# Patient Record
Sex: Female | Born: 1940 | Race: White | Hispanic: No | Smoking: Former smoker
Health system: Southern US, Community
[De-identification: ages and names within clinical notes are randomized; demographics above are authoritative.]

## PROBLEM LIST (undated history)

## (undated) DIAGNOSIS — R7303 Prediabetes: Secondary | ICD-10-CM

## (undated) DIAGNOSIS — R112 Nausea with vomiting, unspecified: Secondary | ICD-10-CM

## (undated) DIAGNOSIS — M069 Rheumatoid arthritis, unspecified: Secondary | ICD-10-CM

## (undated) DIAGNOSIS — T8859XA Other complications of anesthesia, initial encounter: Secondary | ICD-10-CM

## (undated) DIAGNOSIS — I776 Arteritis, unspecified: Secondary | ICD-10-CM

## (undated) DIAGNOSIS — M1990 Unspecified osteoarthritis, unspecified site: Secondary | ICD-10-CM

## (undated) DIAGNOSIS — R5382 Chronic fatigue, unspecified: Secondary | ICD-10-CM

## (undated) DIAGNOSIS — M797 Fibromyalgia: Secondary | ICD-10-CM

## (undated) DIAGNOSIS — M5430 Sciatica, unspecified side: Secondary | ICD-10-CM

## (undated) DIAGNOSIS — T4145XA Adverse effect of unspecified anesthetic, initial encounter: Secondary | ICD-10-CM

## (undated) DIAGNOSIS — Z9889 Other specified postprocedural states: Secondary | ICD-10-CM

## (undated) DIAGNOSIS — M7580 Other shoulder lesions, unspecified shoulder: Secondary | ICD-10-CM

## (undated) HISTORY — PX: ANUS SURGERY: SHX302

## (undated) HISTORY — DX: Chronic fatigue, unspecified: R53.82

## (undated) HISTORY — PX: TONSILLECTOMY: SUR1361

## (undated) HISTORY — DX: Unspecified osteoarthritis, unspecified site: M19.90

## (undated) HISTORY — DX: Other shoulder lesions, unspecified shoulder: M75.80

## (undated) HISTORY — PX: BACK SURGERY: SHX140

## (undated) HISTORY — PX: ABDOMINAL HYSTERECTOMY: SHX81

## (undated) HISTORY — PX: CHOLECYSTECTOMY: SHX55

## (undated) HISTORY — PX: TUBAL LIGATION: SHX77

## (undated) HISTORY — DX: Fibromyalgia: M79.7

## (undated) HISTORY — DX: Rheumatoid arthritis, unspecified: M06.9

## (undated) HISTORY — PX: OTHER SURGICAL HISTORY: SHX169

## (undated) HISTORY — DX: Sciatica, unspecified side: M54.30

## (undated) HISTORY — DX: Arteritis, unspecified: I77.6

---

## 2000-09-09 ENCOUNTER — Encounter: Payer: Self-pay | Admitting: Internal Medicine

## 2000-09-09 ENCOUNTER — Ambulatory Visit (HOSPITAL_COMMUNITY): Admission: RE | Admit: 2000-09-09 | Discharge: 2000-09-09 | Payer: Self-pay | Admitting: Internal Medicine

## 2001-07-13 ENCOUNTER — Encounter (HOSPITAL_COMMUNITY): Admission: RE | Admit: 2001-07-13 | Discharge: 2001-08-12 | Payer: Self-pay | Admitting: General Surgery

## 2001-09-05 ENCOUNTER — Encounter (HOSPITAL_COMMUNITY): Admission: RE | Admit: 2001-09-05 | Discharge: 2001-10-05 | Payer: Self-pay | Admitting: Rheumatology

## 2001-10-31 ENCOUNTER — Ambulatory Visit: Admission: RE | Admit: 2001-10-31 | Discharge: 2001-10-31 | Payer: Self-pay | Admitting: General Surgery

## 2002-04-23 ENCOUNTER — Encounter: Payer: Self-pay | Admitting: Emergency Medicine

## 2002-04-23 ENCOUNTER — Emergency Department (HOSPITAL_COMMUNITY): Admission: EM | Admit: 2002-04-23 | Discharge: 2002-04-23 | Payer: Self-pay | Admitting: Emergency Medicine

## 2002-04-30 ENCOUNTER — Encounter: Payer: Self-pay | Admitting: General Surgery

## 2002-04-30 ENCOUNTER — Ambulatory Visit (HOSPITAL_COMMUNITY): Admission: RE | Admit: 2002-04-30 | Discharge: 2002-04-30 | Payer: Self-pay | Admitting: General Surgery

## 2002-05-29 ENCOUNTER — Encounter (HOSPITAL_COMMUNITY): Admission: RE | Admit: 2002-05-29 | Discharge: 2002-06-28 | Payer: Self-pay | Admitting: Oncology

## 2002-05-29 ENCOUNTER — Encounter: Admission: RE | Admit: 2002-05-29 | Discharge: 2002-05-29 | Payer: Self-pay | Admitting: Oncology

## 2002-06-26 ENCOUNTER — Ambulatory Visit (HOSPITAL_COMMUNITY): Admission: RE | Admit: 2002-06-26 | Discharge: 2002-06-26 | Payer: Self-pay | Admitting: Orthopaedic Surgery

## 2003-02-05 ENCOUNTER — Ambulatory Visit (HOSPITAL_COMMUNITY): Admission: RE | Admit: 2003-02-05 | Discharge: 2003-02-05 | Payer: Self-pay | Admitting: General Surgery

## 2003-02-13 ENCOUNTER — Inpatient Hospital Stay (HOSPITAL_COMMUNITY): Admission: AD | Admit: 2003-02-13 | Discharge: 2003-02-20 | Payer: Self-pay | Admitting: General Surgery

## 2003-02-26 ENCOUNTER — Encounter: Admission: RE | Admit: 2003-02-26 | Discharge: 2003-02-26 | Payer: Self-pay | Admitting: Oncology

## 2003-02-26 ENCOUNTER — Encounter (HOSPITAL_COMMUNITY): Admission: RE | Admit: 2003-02-26 | Discharge: 2003-03-28 | Payer: Self-pay | Admitting: Oncology

## 2003-03-02 ENCOUNTER — Ambulatory Visit (HOSPITAL_COMMUNITY): Admission: RE | Admit: 2003-03-02 | Discharge: 2003-03-02 | Payer: Self-pay | Admitting: General Surgery

## 2006-04-15 ENCOUNTER — Encounter (HOSPITAL_COMMUNITY): Admission: RE | Admit: 2006-04-15 | Discharge: 2006-05-15 | Payer: Self-pay | Admitting: Oncology

## 2006-04-26 ENCOUNTER — Ambulatory Visit (HOSPITAL_COMMUNITY): Payer: Self-pay | Admitting: Oncology

## 2006-06-28 ENCOUNTER — Ambulatory Visit (HOSPITAL_COMMUNITY): Admission: RE | Admit: 2006-06-28 | Discharge: 2006-06-28 | Payer: Self-pay | Admitting: Family Medicine

## 2008-01-23 ENCOUNTER — Ambulatory Visit (HOSPITAL_COMMUNITY): Admission: RE | Admit: 2008-01-23 | Discharge: 2008-01-23 | Payer: Self-pay | Admitting: Family Medicine

## 2008-08-12 ENCOUNTER — Ambulatory Visit (HOSPITAL_COMMUNITY): Admission: RE | Admit: 2008-08-12 | Discharge: 2008-08-12 | Payer: Self-pay | Admitting: Family Medicine

## 2009-03-31 ENCOUNTER — Encounter (INDEPENDENT_AMBULATORY_CARE_PROVIDER_SITE_OTHER): Payer: Self-pay | Admitting: *Deleted

## 2009-03-31 LAB — CONVERTED CEMR LAB
ALT: 19 units/L
AST: 21 units/L
Albumin: 4.2 g/dL
Alkaline Phosphatase: 56 units/L
Bilirubin, Direct: 0.1 mg/dL
Cholesterol: 205 mg/dL
HDL: 48 mg/dL
LDL Cholesterol: 129 mg/dL
Total Protein: 6.7 g/dL
Triglycerides: 138 mg/dL

## 2009-08-12 ENCOUNTER — Encounter: Payer: Self-pay | Admitting: Orthopedic Surgery

## 2009-08-12 ENCOUNTER — Encounter (INDEPENDENT_AMBULATORY_CARE_PROVIDER_SITE_OTHER): Payer: Self-pay | Admitting: *Deleted

## 2009-08-12 DIAGNOSIS — Z8719 Personal history of other diseases of the digestive system: Secondary | ICD-10-CM | POA: Insufficient documentation

## 2009-08-12 DIAGNOSIS — IMO0001 Reserved for inherently not codable concepts without codable children: Secondary | ICD-10-CM | POA: Insufficient documentation

## 2009-08-12 DIAGNOSIS — F329 Major depressive disorder, single episode, unspecified: Secondary | ICD-10-CM | POA: Insufficient documentation

## 2009-08-12 DIAGNOSIS — I10 Essential (primary) hypertension: Secondary | ICD-10-CM | POA: Insufficient documentation

## 2009-08-12 DIAGNOSIS — D649 Anemia, unspecified: Secondary | ICD-10-CM | POA: Insufficient documentation

## 2009-08-12 DIAGNOSIS — F411 Generalized anxiety disorder: Secondary | ICD-10-CM | POA: Insufficient documentation

## 2009-08-14 ENCOUNTER — Ambulatory Visit: Payer: Self-pay | Admitting: Cardiology

## 2009-08-14 DIAGNOSIS — R5381 Other malaise: Secondary | ICD-10-CM | POA: Insufficient documentation

## 2009-08-14 DIAGNOSIS — R5383 Other fatigue: Secondary | ICD-10-CM

## 2009-08-14 DIAGNOSIS — R079 Chest pain, unspecified: Secondary | ICD-10-CM | POA: Insufficient documentation

## 2009-08-15 ENCOUNTER — Encounter: Payer: Self-pay | Admitting: Cardiology

## 2009-08-15 ENCOUNTER — Encounter (INDEPENDENT_AMBULATORY_CARE_PROVIDER_SITE_OTHER): Payer: Self-pay | Admitting: *Deleted

## 2009-08-15 DIAGNOSIS — F17200 Nicotine dependence, unspecified, uncomplicated: Secondary | ICD-10-CM | POA: Insufficient documentation

## 2009-08-15 LAB — CONVERTED CEMR LAB
ALT: 11 units/L
AST: 19 units/L
Albumin: 4.4 g/dL
Alkaline Phosphatase: 50 units/L
BUN: 10 mg/dL
Basophils Absolute: 0 10*3/uL
Basophils Relative: 1 %
CO2: 27 meq/L
Calcium: 9.7 mg/dL
Chloride: 101 meq/L
Creatinine, Ser: 0.82 mg/dL
Eosinophils Absolute: 0.1 10*3/uL
Eosinophils Relative: 2 %
Glucose, Bld: 96 mg/dL
HCT: 30.8 %
Hemoglobin: 9.2 g/dL
Lymphocytes Relative: 36 %
Lymphs Abs: 0.1 10*3/uL
MCHC: 4.83 g/dL
MCV: 63.8 fL
Monocytes Absolute: 0.1 10*3/uL
Neutro Abs: 2 10*3/uL
Platelets: 321 10*3/uL
Potassium: 5.1 meq/L
RDW: 15.9 %
Sodium: 138 meq/L
TSH: 0.703 microintl units/mL
Total Protein: 7 g/dL
WBC: 5.6 10*3/uL

## 2009-08-18 ENCOUNTER — Encounter (INDEPENDENT_AMBULATORY_CARE_PROVIDER_SITE_OTHER): Payer: Self-pay | Admitting: *Deleted

## 2009-08-18 LAB — CONVERTED CEMR LAB
ALT: 11 units/L (ref 0–35)
AST: 19 units/L (ref 0–37)
Albumin: 4.4 g/dL (ref 3.5–5.2)
Alkaline Phosphatase: 50 units/L (ref 39–117)
BUN: 10 mg/dL (ref 6–23)
Basophils Absolute: 0 10*3/uL (ref 0.0–0.1)
Basophils Relative: 1 % (ref 0–1)
CO2: 27 meq/L (ref 19–32)
Calcium: 9.7 mg/dL (ref 8.4–10.5)
Chloride: 101 meq/L (ref 96–112)
Cortisol, Plasma: 7.4 ug/dL
Creatinine, Ser: 0.82 mg/dL (ref 0.40–1.20)
Eosinophils Absolute: 0.1 10*3/uL (ref 0.0–0.7)
Eosinophils Relative: 2 % (ref 0–5)
Glucose, Bld: 96 mg/dL (ref 70–99)
HCT: 30.8 % — ABNORMAL LOW (ref 36.0–46.0)
Hemoglobin: 9.2 g/dL — ABNORMAL LOW (ref 12.0–15.0)
Lymphocytes Relative: 36 % (ref 12–46)
Lymphs Abs: 2 10*3/uL (ref 0.7–4.0)
MCHC: 29.9 g/dL — ABNORMAL LOW (ref 30.0–36.0)
MCV: 63.8 fL — ABNORMAL LOW (ref 78.0–100.0)
Monocytes Absolute: 0.1 10*3/uL (ref 0.1–1.0)
Monocytes Relative: 1 % — ABNORMAL LOW (ref 3–12)
Neutro Abs: 3.5 10*3/uL (ref 1.7–7.7)
Neutrophils Relative %: 61 % (ref 43–77)
Platelets: 321 10*3/uL (ref 150–400)
Potassium: 5.1 meq/L (ref 3.5–5.3)
RBC: 4.83 M/uL (ref 3.87–5.11)
RDW: 15.9 % — ABNORMAL HIGH (ref 11.5–15.5)
Sodium: 138 meq/L (ref 135–145)
TSH: 0.703 microintl units/mL (ref 0.350–4.500)
Total Bilirubin: 0.5 mg/dL (ref 0.3–1.2)
Total Protein: 7 g/dL (ref 6.0–8.3)
WBC: 5.6 10*3/uL (ref 4.0–10.5)

## 2009-08-26 ENCOUNTER — Telehealth (INDEPENDENT_AMBULATORY_CARE_PROVIDER_SITE_OTHER): Payer: Self-pay | Admitting: *Deleted

## 2009-08-29 ENCOUNTER — Encounter (INDEPENDENT_AMBULATORY_CARE_PROVIDER_SITE_OTHER): Payer: Self-pay | Admitting: *Deleted

## 2009-08-29 ENCOUNTER — Ambulatory Visit: Payer: Self-pay | Admitting: Cardiology

## 2009-08-29 LAB — CONVERTED CEMR LAB
Ferritin: 150 ng/mL
Ferritin: 150 ng/mL (ref 10–291)
Iron: 36 ug/dL
Iron: 36 ug/dL — ABNORMAL LOW (ref 42–145)
OCCULT 1: NEGATIVE
OCCULT 2: NEGATIVE
OCCULT 3: NEGATIVE
Saturation Ratios: 11 %
Saturation Ratios: 11 % — ABNORMAL LOW (ref 20–55)
TIBC: 314 ug/dL
TIBC: 314 ug/dL (ref 250–470)
UIBC: 278 ug/dL
UIBC: 278 ug/dL

## 2009-09-01 ENCOUNTER — Encounter: Payer: Self-pay | Admitting: Cardiology

## 2009-09-04 ENCOUNTER — Encounter: Payer: Self-pay | Admitting: Cardiology

## 2009-09-04 ENCOUNTER — Ambulatory Visit (HOSPITAL_COMMUNITY): Admission: RE | Admit: 2009-09-04 | Discharge: 2009-09-04 | Payer: Self-pay | Admitting: Cardiology

## 2009-09-04 ENCOUNTER — Ambulatory Visit: Payer: Self-pay | Admitting: Cardiology

## 2009-09-24 ENCOUNTER — Encounter (INDEPENDENT_AMBULATORY_CARE_PROVIDER_SITE_OTHER): Payer: Self-pay | Admitting: *Deleted

## 2009-10-27 ENCOUNTER — Encounter (INDEPENDENT_AMBULATORY_CARE_PROVIDER_SITE_OTHER): Payer: Self-pay | Admitting: *Deleted

## 2009-10-28 ENCOUNTER — Ambulatory Visit: Payer: Self-pay | Admitting: Cardiology

## 2009-12-26 ENCOUNTER — Encounter: Payer: Self-pay | Admitting: Orthopedic Surgery

## 2010-01-14 ENCOUNTER — Ambulatory Visit: Payer: Self-pay | Admitting: Orthopedic Surgery

## 2010-01-14 DIAGNOSIS — M5030 Other cervical disc degeneration, unspecified cervical region: Secondary | ICD-10-CM | POA: Insufficient documentation

## 2010-01-14 DIAGNOSIS — M25819 Other specified joint disorders, unspecified shoulder: Secondary | ICD-10-CM | POA: Insufficient documentation

## 2010-01-14 DIAGNOSIS — M75100 Unspecified rotator cuff tear or rupture of unspecified shoulder, not specified as traumatic: Secondary | ICD-10-CM | POA: Insufficient documentation

## 2010-01-14 DIAGNOSIS — M7580 Other shoulder lesions, unspecified shoulder: Secondary | ICD-10-CM

## 2010-01-16 ENCOUNTER — Encounter: Payer: Self-pay | Admitting: Orthopedic Surgery

## 2010-01-26 ENCOUNTER — Encounter (HOSPITAL_COMMUNITY)
Admission: RE | Admit: 2010-01-26 | Discharge: 2010-02-25 | Payer: Self-pay | Source: Home / Self Care | Admitting: Orthopedic Surgery

## 2010-02-03 ENCOUNTER — Encounter: Payer: Self-pay | Admitting: Orthopedic Surgery

## 2010-02-25 ENCOUNTER — Encounter: Payer: Self-pay | Admitting: Orthopedic Surgery

## 2010-02-27 ENCOUNTER — Encounter (HOSPITAL_COMMUNITY)
Admission: RE | Admit: 2010-02-27 | Discharge: 2010-03-29 | Payer: Self-pay | Source: Home / Self Care | Attending: Orthopedic Surgery | Admitting: Orthopedic Surgery

## 2010-04-01 ENCOUNTER — Encounter (HOSPITAL_COMMUNITY)
Admission: RE | Admit: 2010-04-01 | Discharge: 2010-04-28 | Payer: Self-pay | Source: Home / Self Care | Attending: Orthopedic Surgery | Admitting: Orthopedic Surgery

## 2010-04-02 ENCOUNTER — Encounter: Payer: Self-pay | Admitting: Orthopedic Surgery

## 2010-04-28 NOTE — Letter (Signed)
Summary: Previous office note from Dr. Dion Saucier  Previous office note from Dr. Dion Saucier   Imported By: Jacklynn Ganong 01/16/2010 08:16:25  _____________________________________________________________________  External Attachment:    Type:   Image     Comment:   External Document

## 2010-04-28 NOTE — Miscellaneous (Signed)
Summary: labs cbcd,cmp,08/15/2009  Clinical Lists Changes  Observations: Added new observation of ABSOLUTE BAS: 0.0 K/uL (08/15/2009 15:47) Added new observation of BASOPHIL %: 1 % (08/15/2009 15:47) Added new observation of EOS ABSLT: 0.1 K/uL (08/15/2009 15:47) Added new observation of % EOS AUTO: 2 % (08/15/2009 15:47) Added new observation of ABSOLUTE MON: 0.1 K/uL (08/15/2009 15:47) Added new observation of ABS LYMPHOCY: 0.1 K/uL (08/15/2009 15:47) Added new observation of LYMPHS %: 36 % (08/15/2009 15:47) Added new observation of ABS NEUTROPH: 2.0 K/uL (08/15/2009 15:47) Added new observation of RDW: 15.9 % (08/15/2009 15:47) Added new observation of MCHC RBC: 4.83 g/dL (16/12/9602 54:09) Added new observation of CALCIUM: 9.7 mg/dL (81/19/1478 29:56) Added new observation of ALBUMIN: 4.4 g/dL (21/30/8657 84:69) Added new observation of PROTEIN, TOT: 7.0 g/dL (62/95/2841 32:44) Added new observation of SGPT (ALT): 11 units/L (08/15/2009 15:47) Added new observation of SGOT (AST): 19 units/L (08/15/2009 15:47) Added new observation of ALK PHOS: 50 units/L (08/15/2009 15:47) Added new observation of CREATININE: 0.82 mg/dL (03/31/7251 66:44) Added new observation of BUN: 10 mg/dL (03/47/4259 56:38) Added new observation of BG RANDOM: 96 mg/dL (75/64/3329 51:88) Added new observation of CO2 PLSM/SER: 27 meq/L (08/15/2009 15:47) Added new observation of CL SERUM: 101 meq/L (08/15/2009 15:47) Added new observation of K SERUM: 5.1 meq/L (08/15/2009 15:47) Added new observation of NA: 138 meq/L (08/15/2009 15:47) Added new observation of PLATELETK/UL: 321 K/uL (08/15/2009 15:47) Added new observation of MCV: 63.8 fL (08/15/2009 15:47) Added new observation of HCT: 30.8 % (08/15/2009 15:47) Added new observation of HGB: 9.2 g/dL (41/66/0630 16:01) Added new observation of WBC COUNT: 5.6 10*3/microliter (08/15/2009 15:47) Added new observation of TSH: 0.703 microintl units/mL (08/15/2009  15:47)

## 2010-04-28 NOTE — Letter (Signed)
Summary: Notes from Dr Hilda Lias, MD  Notes from Dr Hilda Lias, MD   Imported By: Jacklynn Ganong 01/21/2010 15:39:22  _____________________________________________________________________  External Attachment:    Type:   Image     Comment:   External Document

## 2010-04-28 NOTE — Letter (Signed)
Summary: Stress Echocardiogram Information Sheet  [facility]  [address] [place] [address]   Phone: [phone]  Fax: [phone]      Aug 14, 2009 MRN: [id number] light prior to the test.   Alfa Florendo  Doctor: Appointment Date: Appointment Time: Appointment Location: [facility]  Stress Echocardiogram Information Sheet    Instructions:   1. DO NOT  take your _________ medicine _______ days before the test.  2. Eat light prior to the test.  3. Dress prepared to exercise.  4. DO NOT use ANY caffine or tobacco products 3 hours before appointment.  5. Report to the Short Stay Center on the1st floor.  6. Please bring all current prescription medications.  7. If you have any questions, please call [phone]  8. Do not take Metoprolol the morning of test.

## 2010-04-28 NOTE — Miscellaneous (Signed)
Summary: hemoccult cards 08/29/2009  Clinical Lists Changes  Observations: Added new observation of HEMOCCULT 3: neg (08/29/2009 17:12) Added new observation of HEMOCCULT 2: neg (08/29/2009 17:12) Added new observation of HEMOCCULT 1: neg (08/29/2009 17:12)

## 2010-04-28 NOTE — Letter (Signed)
Summary: [facility] at [facility]  [address], [place] [address]   Phone: [phone]  Fax: [phone]      September 01, 2009 MRN: [id number]   [facility] [address] [place], [place]  [address]   Dear Ms. Blauvelt,  Your test ordered by Selena Batten has been reviewed by your physician (or physician assistant) and was found to be normal or stable. Your physician (or physician assistant) felt no changes were needed at this time.  ____ Echocardiogram  ____ Cardiac Stress Test  __X__ Lab Work  ____ Peripheral vascular study of arms, legs or neck  ____ CT scan or X-ray  ____ Lung or Breathing test  ____ Other: Please continue on current medical treatment.   Thank you.   [place] Bing, MD, F.A.C.C

## 2010-04-28 NOTE — Assessment & Plan Note (Signed)
Summary: ROV   Visit Type:  Follow-up Primary Provider:  Dr. Katharine Look   History of Present Illness: Kiara [name] returns to the office as previously scheduled for continuing assessment and treatment of chest discomfort, generalized weakness and exercise intolerance.  Since her last visit, symptoms have improved spontaneously.  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  For the most part, she has been able to do all of her usual activities without difficulty including maintaining her garden and performing housework.  She has had no significant chest discomfort and no palpitations.  There has been no lightheadedness nor syncope.  Current Medications (verified): 1)  Xanax 1 Mg Tabs (Alprazolam) .... Take 1 Tab At Bedtime 2)  Coq10 100 Mg Caps (Coenzyme Q10) .... T5ake 1 Tab Daily 3)  Caltrate 600 1500 Mg Tabs (Calcium Carbonate) .... Take 1 Tab Daily 4)  B-100 Complex  Tabs (Vitamins-Lipotropics) .... Take 1 Tab Daily 5)  Hydrocodone-Acetaminophen 7.5-650 Mg Tabs (Hydrocodone-Acetaminophen) .... Take As Needed For Pain 6)  Metoprolol Succinate 50 Mg Xr24h-Tab (Metoprolol Succinate) .... Take 1 Tab Daily 7)  Pepcid 40 Mg Tabs (Famotidine) .... Take 1 Tab Daily 8)  Amitriptyline Hcl 50 Mg Tabs (Amitriptyline Hcl) .... Take 1 or 2 Tabs At Bedtime  Allergies (verified): 1)  ! * Nonsteroidals 2)  ! Steroids  Past History:  PMH, FH, and Social History reviewed and updated.  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  Vital Signs:  Patient profile:   70 year old female Weight:      172 pounds Pulse rate:   83 / minute BP sitting:   122 / 69  (right arm)  Vitals Entered By: Dreama Saa, CNA (October 28, 2009 11:28 AM)  Physical Exam  General:  No acute distress; normal affect HEENT-[place]/AT; PERRL; EOM intact; conjunctiva and lids nl:  Neck-No JVD; no carotid bruits: Endocrine-No thyromegaly: Lungs-No tachypnea, clear without rales, rhonchi or wheezes: CV-normal PMI; split S1; normal S2 Abdomen-BS normal; soft and non-tender without masses or organomegaly: Neurologic-Nl cranial nerves; symmetric strength and tone: Skin- Warm, patches of erythema over the lower extremities Extremities-Nl distal pulses; no edema    Impression & Recommendations:  Problem # 1:  CHEST PAIN (ICD-786.50) Symptoms have resolved spontaneously.  A negative stress echocardiogram provides additional reassurance of the absence of significant cardiovascular disease.  No further evaluation is necessary.  Problem # 2:  OTHER MALAISE AND FATIGUE (ICD-780.79) Patient has a chronic microcytic anemia with low iron but normal ferritin.  She has previously been evaluated by Dr. 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  Her moderate anemia could certainly be contributing to her symptoms.  Problem # 3:  HYPERTENSION (ICD-401.9) Blood pressure remains well controlled on a single agent.  Problem # 4:  DEPRESSION (ICD-311) Patient is much more animated and positive at this visit.  Possibly, symptoms were related to depression, which is now improved.  I see no ongoing cardiovascular issues of significance in this nice woman and will be happy to  see her at anytime in the future that Dr. Sudie Bailey deems appropriate.  Patient Instructions: 1)  Your physician recommends that you schedule a follow-up appointment in: as needed  2)  Your physician  recommends that you continue on your current medications as directed. Please refer to the Current Medication list given to you today.  Prevention & Chronic Care Immunizations   Influenza vaccine: Not documented    Tetanus booster: Not documented    Pneumococcal vaccine: Not documented    H. zoster vaccine: Not documented  Colorectal Screening   Hemoccult: Not documented    Colonoscopy: Not documented  Other Screening   Pap smear: Not documented    Mammogram: Not documented    DXA bone density scan: Not documented   Smoking status: quit  (08/12/2009)  Lipids   Total Cholesterol: 205  (03/31/2009)   LDL: 129  (03/31/2009)   LDL Direct: Not documented   HDL: 48  (03/31/2009)   Triglycerides: 138  (03/31/2009)  Hypertension   Last Blood Pressure: 122 / 69  (10/28/2009)   Serum creatinine: 0.82  (08/15/2009)   Serum potassium 5.1  (08/15/2009)  Self-Management Support :    Hypertension self-management support: Not documented

## 2010-04-28 NOTE — Assessment & Plan Note (Signed)
Summary: CONSULT/TREAT LT SHOULDER PAIN/NEEDS XRAY/MEDICARE,MEDICAID/R...   Vital Signs:  Patient profile:   70 year old female Height:      65 inches Weight:      175 pounds  Visit Type:  new patient Referring Provider:  self Primary Provider:  Dr. Katharine Look  CC:  neck pain.  History of Present Illness: I saw Kiara [name] in the office today for an initial visit.  She is a 70 years old woman with the complaint of:  neck pain that radiates to the shoulder.  No injury.  MRI and xrays of C spine from 2010 for review.  Meds: Metoprolol 50mg , Venlafaxine hcl 5mg , Amitriptyline 100mg , Xanax 1mg , Hydrocodone 7.5/650, Vitamins.  The patient presents with pain in her neck which radiates to her LEFT shoulder and also over the anterolateral acromion posterior scapula which is described as dull throbbing stabbing and burning in it is constant.  She has a history of a cervical spine vertebral fracture in 1979 which she recovered from.  She was seen last year in [place] for neck and shoulder pain and was told that she would need some rotator cuff surgery and that her pain was not primarily coming from her neck despite an MRI done in May of 2010 which shows C5 and 6, C6 and 7 broad-based disc protrusions but no foraminal stenosis.  Also on an MRI there was a C3-C4 disc shallow protrusion but no stenosis.  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  Several nodes are also included however they are from Dr. Hilda Lias and Dr. Michelle Nasuti office notes.  Dr. Hilda Lias has seen her for her hip for bursitis on the RIGHT medial meniscal tear.  She's had a history of ruptured disc in her lower back.  Dr. Sanjuan Dame notes go back to 2004.          Allergies: 1)  ! * Nonsteroidals 2)  ! 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  Past Surgical History: Tonsillectomy Partial thyroidectomy Tubal ligation Hysterectomy-1985 Cholecystectomy-1995 Lumbosacral spine surgery x3 in 1978, 1980 and 1984 Laparoscopic knee surgery x2 in 1989 and 2005 left anal  Family History: Father died at age 24 due to pneumonia Mother died at age 70 with congestive heart failure Siblings: 4 brothers are alive and well; one of 3 sisters suffered a fatal CVA at age 61 Family History of Diabetes Family History Coronary Heart Disease female < 20 Family History of Arthritis  Social History: Disabled  Marital-divorced Tobacco Use -no, quit Alcohol Use - no Regular Exercise - no Drug Use - no 3 cups of caffeine per day GED  Review of Systems Constitutional:  Complains of fever, chills, and fatigue; denies weight loss and weight gain. Cardiovascular:  Complains of fainting; denies chest pain, palpitations, and murmurs. Respiratory:  Complains of short of breath and snoring; denies wheezing, couch, tightness, pain on inspiration, and snoring . Gastrointestinal:  Complains of blood in your stools; denies heartburn, nausea, vomiting, diarrhea, and constipation. Genitourinary:  Complains of difficulty urinating, painful urination, flank pain, and bleeding in urine; denies frequency and  urgency. Neurologic:  Denies numbness, tingling, unsteady gait, dizziness, tremors, and seizure. Musculoskeletal:  Complains of joint pain, swelling, instability, stiffness, redness, heat, and muscle pain. Endocrine:  Complains of heat or cold intolerance; denies excessive thirst and exessive  urination. Psychiatric:  Complains of nervousness and depression; denies anxiety and hallucinations. Skin:  Complains of changes in the skin, rash, itching, and redness; denies poor healing. HEENT:  Complains of eye pain and redness; denies blurred or double vision and watering. Immunology:  Complains of seasonal allergies; denies sinus problems and allergic to bee stings. Hemoatologic:  Denies easy bleeding and brusing.  Physical Exam  Additional Exam:  GEN: well developed, well nourished, normal grooming and hygiene, no deformity and normal body habitus.   CDV: pulses are normal, no edema, no erythema. no tenderness  Lymph: normal lymph nodes   Skin: no rashes, skin lesions or open sores   NEURO: normal coordination, reflexes, sensation.   Psyche: awake, alert and oriented. Mood normal   Gait: Normal  She does lasted cervical spine stiffness loss of motion and tenderness at the base of cervical spine.  Spurling sign is negative.  She has some LEFT shoulder pain along the a.c. joint, acromion.  Posterior subacromial space.  She has painful range of motion in the arc of 90-180.  His active range of motion of her 120.  She has mild weakness in the rotator cuff supraspinatus.  She has a negative Hornblower sign.  Negative drop test.  She has a positive impingement sign with Neer maneuver and also with the Hawkins maneuver.  An abduction external rotation she is stable and she has a negative sulcus sign.       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  Problem # 2:  DEGENERATIVE DISC DISEASE, CERVICAL SPINE  (ICD-722.4) Assessment: New  seek treatment with neurosurgeon if needed for the cervical spine  Other Orders: Physical Therapy Referral (PT) Consultation Level III (91478) Joint Aspirate / Injection, Large (20610)  Patient Instructions: 1)  Start physical therapy for the left shoulder Rotator Cuff Disease  2)  If things are not better after 8 weeks then call us back.    Orders Added: 1)  Physical Therapy Referral [PT] 2)  Consultation Level III [29562] 3)  Joint Aspirate / Injection, Large [20610]  Appended Document: CONSULT/TREAT LT SHOULDER PAIN/NEEDS XRAY/MEDICARE,MEDICAID/R... Kiara [name] saw Dr. 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 He offered her injection and physical therapy, but she indicated she was allergic to cortisone and she did here. She had decided to go ahead with a cuff repair, but eventually declined.

## 2010-04-28 NOTE — Progress Notes (Signed)
Summary: questions about stool card and labs  Phone Note Call from Patient Call back at Home Phone [phone]   Caller: pt Reason for Call: Talk to Nurse Summary of Call: S: pt left message on Monday that she was having a hard time figuring out when she was supose to be doing her stool cards and her lab work. Initial call taken by: Faythe Ghee,  Aug 26, 2009 8:45 AM  Follow-up for Phone Call        B: stool cards ordered from [facility] on 08/15/09 A: pt didn't do cards correctly by instructions R: will come to office and pick up another set of 3 Follow-up by: Teressa Lower RN,  Aug 26, 2009 9:45 AM

## 2010-04-28 NOTE — Miscellaneous (Signed)
Summary: OT clinical evaluation  OT clinical evaluation   Imported By: Jacklynn Ganong 02/03/2010 16:59:10  _____________________________________________________________________  External Attachment:    Type:   Image     Comment:   External Document

## 2010-04-28 NOTE — Letter (Signed)
Summary: *Orthopedic Consult Note  [facility]  [address] [place] [address]  [place], [place] [address]   Phone: [phone]  Fax: [phone]    Re:    Kiara [name] DOB:    01/27/1941   Dear: Dr Sudie Bailey   Thank you for requesting that we see the above patient for consultation.  A copy of the detailed office note will be sent under separate cover, for your review.  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  Sincerely,   Terrance Mass. MD.

## 2010-04-28 NOTE — Miscellaneous (Signed)
Summary: OT progress note  OT progress note   Imported By: Jacklynn Ganong 02/26/2010 09:16:41  _____________________________________________________________________  External Attachment:    Type:   Image     Comment:   External Document

## 2010-04-28 NOTE — Assessment & Plan Note (Signed)
Summary: Initial Assessment   Visit Type:  Initial Consult Primary Provider:  Dr. Katharine Look   History of Present Illness: Kiara [name] is seen at Kiara [name] request of Dr. Sudie Bailey for evaluation of multiple symptoms in Kiara setting of a strong family history for heart disease.  Kiara [name] underwent stress testing for chest discomfort approximately 10 years ago with apparently negative results.  She describes bilateral rotator cuff problems with some pain in Kiara shoulders, arms and over Kiara latissimus dorsi.  She dramatically presents multiple other symptoms including diffuse pain in multiple body regions, malaise and a sense of "weakness in her heart".  Kiara did not clearly represent chest pain, chest pressure or palpitations.  She has impaired exercise capacity.  She reports they did in variety of diagnoses, some of which, such as thalassemia major, appear highly unlikely.  She has had multiple surgical procedures.  She describes recent upper respiratory infection symptoms.  Records obtained from Dr. Michelle Nasuti office and reviewed.  Current Medications (verified): 1)  Xanax 1 Mg Tabs (Alprazolam) .... Take 1 Tab Three Times A Day 2)  Coq10 100 Mg Caps (Coenzyme Q10) .... T5ake 1 Tab Daily 3)  Caltrate 600 1500 Mg Tabs (Calcium Carbonate) .... Take 1 Tab Daily 4)  B-100 Complex  Tabs (Vitamins-Lipotropics) .... Take 1 Tab Daily 5)  Allegra 180 Mg Tabs (Fexofenadine Hcl) .... Take 1 Tab Daily 6)  Hydrocodone-Acetaminophen 7.5-500 Mg Tabs (Hydrocodone-Acetaminophen) .... Take As Needed For Pain 7)  Metoprolol Tartrate 25 Mg Tabs (Metoprolol Tartrate) .... Take 1 Tab Two Times A Day 8)  Pepcid 40 Mg Tabs (Famotidine) .... Take 1 Tab Daily 9)  Amitriptyline Hcl 50 Mg Tabs (Amitriptyline Hcl) .... Take 1 Tab Daily  Allergies (verified): 1)  ! * Nonsteroidals 2)  ! Steroids  Past History:  Family History: Last updated: 09-08-2009 Father died at age 20 due to pneumonia Mother died at age  65 with congestive heart failure Siblings: 4 brothers are alive and well; one of 3 sisters suffered a fatal CVA at age 37  Social History: Last updated: September 08, 2009 Disabled  Marital-divorced Tobacco Use -past consumption of 2 packs per day; now minimal Alcohol Use - no Regular Exercise - no Drug Use - no  Past Medical History: Family history of cardiac disease with vague symptoms Tobacco abuse-prior consumption of 2 packs per day; no minimal FIBROMYALGIA (ICD-729.1) HYPERTENSION (ICD-401.9) IRRITABLE BOWEL SYNDROME, HX OF (ICD-V12.79) DEPRESSION (ICD-311) ANXIETY DISORDER (ICD-300.00) Nephrolithiasis ANEMIA (ICD-285.9) Hepatitis A Sleep apnea Osteoporosis Cervical spine injury-1979  Past Surgical History: Tonsillectomy Partial thyroidectomy Tubal ligation Hysterectomy-1985 Cholecystectomy-1995 Lumbosacral spine surgery x3 in 09-01-1976, 1980 and 1984 Laparoscopic knee surgery x2 in 09-02-87 and Sep 02, 2003  Family History: Father died at age 81 due to pneumonia Mother died at age 73 with congestive heart failure Siblings: 4 brothers are alive and well; one of 3 sisters suffered a fatal CVA at age 34  Social History: Disabled  Marital-divorced Tobacco Use -past consumption of 2 packs per day; now minimal Alcohol Use - no Regular Exercise - no Drug Use - no  Review of Systems       intermittent headaches; requires corrective lenses; left cataract; some hearing impairment; upper and lower dentures; intermittent palpitations; gastroesophageal reflux disease symptoms; diffuse arthritic discomfort; intermittent pedal edema.  All other systems reviewed and are negative.  Vital Signs:  [name] profile:   70 year old female Height:      63 inches Weight:      173  pounds BMI:     30.76 Pulse rate:   86 / minute BP sitting:   138 / 78  (right arm)  Vitals Entered By: Kiara Saa, CNA (Aug 14, 2009 1:10 PM)  Physical Exam  General:    Well-developed; no acute distress;  depressed affect HEENT-Kiara [name]/AT; PERRL; EOM intact; conjunctiva and lids nl:  Neck-No JVD; no carotid bruits: Endocrine-No thyromegaly: Lungs-No tachypnea, clear without rales, rhonchi or wheezes: CV-normal PMI; normal S1 and S2:;  Abdomen-BS normal; soft and non-tender without masses or organomegaly: MS-No deformities; abduction of both shoulders is limited Neurologic-Nl cranial nerves; symmetric strength and tone: Skin- Warm, mild punctate macular eruption over Kiara lower extremities Extremities-Nl distal pulses; no edema    Impression & Recommendations:  Problem # 1:  OTHER MALAISE AND FATIGUE (ICD-780.79) [name]'s symptoms are quite vague, but her concerns about cardiac disease are genuine.  We will proceed with an echocardiogram and stress echocardiogram to exclude structural heart disease and coronary artery disease.  Basic laboratory studies have also been requested.  I will reassess Kiara [name] once those studies have been completed.  Problem # 2:  HYPERTENSION (ICD-401.9) Blood pressure control is adequate on a minimal dose of metoprolol.  Hypertension must be modest, if she truly has any hypertension at all.  Problem # 3:  TOBACCO ABUSE (ICD-305.1) Complete abstention from tobacco use would be desirable.  Kiara was discussed with Kiara [name].  Other Orders: 2-D Echocardiogram (2D Echo) Stress Echo (Stress Echo) Future Orders: T-TSH ([id number]) ... 08/15/2009 T-Comprehensive Metabolic Panel [phone]) ... 08/15/2009 T-CBC w/Diff ([id number]) ... 08/15/2009 T- * Misc. Laboratory test [phone]) ... 08/15/2009  [name] Instructions: 1)  Your physician recommends that you schedule a follow-up appointment in: 6 weeks 2)  Your physician recommends that you return for lab work in: tomorrow morning 3)  Your physician has requested that you have a stress echocardiogram. For further information please visit [email or web address]  Please follow instruction sheet as given. 4)   Your physician has requested that you have an echocardiogram.  Echocardiography is a painless test that uses sound waves to create images of your heart. It provides your doctor with information about Kiara size and shape of your heart and how well your heart's chambers and valves are working.  Kiara procedure takes approximately one hour. There are no restrictions for Kiara procedure.

## 2010-04-28 NOTE — Miscellaneous (Signed)
Summary: LABS IRON,UIBC,TIBC,SAT,FERRITIN,08/29/2009  Clinical Lists Changes  Observations: Added new observation of FERRITIN: 150 ng/mL (08/29/2009 9:33) Added new observation of IRON SATUR %: 11 % (08/29/2009 9:33) Added new observation of TIBC: 314 mcg/dL (08/65/7846 9:62) Added new observation of UIBC: 278 mcg/dL (95/28/4132 4:40) Added new observation of IRON: 36 mcg/dL (01/23/2535 6:44)

## 2010-04-28 NOTE — Letter (Signed)
Summary: [place] [employer] [facility], [facility] at [facility]  [address], [place] [address]   Phone: [phone]  Fax: [phone]      Aug 18, 2009 MRN: [id number]   [facility] [place] [address] [place], [place]  [address]   Dear Ms. Perlstein,  Your test ordered by Selena Batten has been reviewed by your physician (or physician assistant) and was found to be normal or stable. Your physician (or physician assistant) felt no changes were needed at this time.  ____ Echocardiogram  ____ Cardiac Stress Test  __x__ Lab Work  ____ Peripheral vascular study of arms, legs or neck  ____ CT scan or X-ray  ____ Lung or Breathing test  ____ Other:  Please restrict the potassium in your diet.  Enclosed is more labwork we would like for you to have done and also stool cards for you to complete and bring to our office.  Enclosed is copy of your labwork for your records. Thank you, Yusuf Yu Allyne Gee RN    Ingalls Bing, MD, Lenise Arena.C.Gaylord Shih, MD, F.A.C.C Lewayne Bunting, MD, F.A.C.C Nona Dell, MD, F.A.C.C Charlton Haws, MD, Lenise Arena.C.C   Appended Document: Orders Update    Clinical Lists Changes  Orders: Added new Test order of T-Iron Binding Capacity (TIBC) ([id number]) - Signed Added new Test order of Hemoccult Cards (Take Home) (Hemoccult Cards) - Signed Added new Test order of T-Iron [phone]) - Signed Added new Test order of T-Ferritin ([id number]) - Signed

## 2010-04-28 NOTE — Miscellaneous (Signed)
Summary: ECHO 09/04/2009  Clinical Lists Changes  Observations: Added new observation of ECHOINTERP:   Study Conclusions    - Left ventricle: The cavity size was normal. Wall thickness was     increased in a pattern of mild LVH. Systolic function was normal.     The estimated ejection fraction was in the range of 55% to 60%.   - Mitral valve: Calcified annulus.   - Pulmonary arteries: PA peak pressure: 35mm Hg (S).   Transthoracic echocardiography. M-mode, complete 2D, spectral   Doppler, and color Doppler. Height: Height: 160cm. Height: 63in.   Weight: Weight: 78.5kg. Weight: 172.6lb. Body mass index: BMI:   30.6kg/m^2. Body surface area: BSA: 1.69m^2. Patient status:   Outpatient. Location: Echo laboratory.  (09/04/2009 9:36)      Echocardiogram  Procedure date:  09/04/2009  Findings:        Study Conclusions    - Left ventricle: The cavity size was normal. Wall thickness was     increased in a pattern of mild LVH. Systolic function was normal.     The estimated ejection fraction was in the range of 55% to 60%.   - Mitral valve: Calcified annulus.   - Pulmonary arteries: PA peak pressure: 35mm Hg (S).   Transthoracic echocardiography. M-mode, complete 2D, spectral   Doppler, and color Doppler. Height: Height: 160cm. Height: 63in.   Weight: Weight: 78.5kg. Weight: 172.6lb. Body mass index: BMI:   30.6kg/m^2. Body surface area: BSA: 1.74m^2. Patient status:   Outpatient. Location: Echo laboratory.

## 2010-04-28 NOTE — Letter (Signed)
Summary: History Form  History Form   Imported By: Jacklynn Ganong 01/21/2010 15:27:57  _____________________________________________________________________  External Attachment:    Type:   Image     Comment:   External Document

## 2010-04-30 NOTE — Miscellaneous (Signed)
Summary: OT discharge summary  OT discharge summary   Imported By: Jacklynn Ganong 04/02/2010 15:41:27  _____________________________________________________________________  External Attachment:    Type:   Image     Comment:   External Document

## 2010-07-22 ENCOUNTER — Ambulatory Visit: Payer: Self-pay | Admitting: Orthopedic Surgery

## 2010-07-23 ENCOUNTER — Ambulatory Visit (INDEPENDENT_AMBULATORY_CARE_PROVIDER_SITE_OTHER): Payer: Medicare Other | Admitting: Orthopedic Surgery

## 2010-07-23 ENCOUNTER — Encounter: Payer: Self-pay | Admitting: Orthopedic Surgery

## 2010-07-23 VITALS — HR 78 | Ht 65.0 in | Wt 170.0 lb

## 2010-07-23 DIAGNOSIS — M1710 Unilateral primary osteoarthritis, unspecified knee: Secondary | ICD-10-CM

## 2010-07-23 DIAGNOSIS — IMO0002 Reserved for concepts with insufficient information to code with codable children: Secondary | ICD-10-CM

## 2010-07-23 MED ORDER — METHYLPREDNISOLONE ACETATE 40 MG/ML IJ SUSP: 40.0000 mg | Freq: Once | INTRAMUSCULAR | Status: DC

## 2010-07-23 NOTE — Discharge Summary (Signed)
Injection LEFT knee.  Consent was obtained.  Time out was taken   LEFT knee was injected with Depo-Medrol 40 mg plus lidocaine 1% 4 cc.  Knee was prepped with alcohol and anesthetized with ethyl chloride.  The injection was tolerated without complication.   

## 2010-07-23 NOTE — Patient Instructions (Signed)
Take 1 aleve daily   You have received a steroid shot. 15% of patients experience increased pain at the injection site with in the next 24 hours. This is best treated with ice and tylenol extra strength 2 tabs every 8 hours. If you are still having pain please call the office.

## 2010-07-23 NOTE — Progress Notes (Signed)
Consult requested by Dr. Sudie Bailey  LEFT knee pain and swelling  Started 10 months ago.  7 onset secondary to an injury.  Dull throbbing stabbing burning pain 9/10 reports constant.  Pain is medial.  It is better with rest is worse with stairs gardening driving.  She reports locking catching and swelling.  System review normal except for fatigue shortness of breath wheezing and snoring, also complains of blood in the stool, has difficulty urinating and hematuria, she reports a rash.  Just some neurologic problems of numbness tingling and unsteady gait she is also nervous with anxiety and depression.  She reports easy bruising and temperature intolerance.  Seasonal ALLERGIES.  She did have a knee arthroscopy:Marland Kitchen Knee arthroscopy and torn medial meniscus by Dr. Hilda Lias in 2004.  Family History  Problem Relation Age of Onset  . Heart disease    . Arthritis    . Diabetes     Past Medical History  Diagnosis Date  . Fibromyalgia   . Chronic fatigue   . AC (acromioclavicular) joint bone spurs   . Arthritis   . Sciatica   . Vasculitis   . Rheumatoid arthritis    Past Surgical History  Procedure Date  . Back surgery     3 back surgeries  . Cholecystectomy   . Abdominal hysterectomy   . Anus surgery   . Left knee     General: The patient is normally developed, with normal grooming and hygiene. There are no gross deformities. The body habitus is normal   CDV: The pulse and perfusion of the extremities are normal   LYMPH: There is no gross lymphadenopathy in the extremities   Skin: There are no rashes, ulcers or cafe-au-lait spot   Psyche: The patient is alert, awake and oriented.  Mood is normal   Neuro:  The coordination and balance are normal.  Sensation is normal. Reflexes are 2+ and equal   Musculoskeletal   LEFT knee medial joint line tenderness small effusion.  Flexion 125.  She does have some pain with McMurray's maneuver.  Her knee is stable muscle strength and tone are  normal skin is intact.  Pulse and temperature are normal no edema.  Negative lymphadenopathy exam sensation is normal reflexes are good balance is normal  The x-ray shows severe arthritis primarily medial with mild varus  2 options he injection on operative treatment vs. Knee replacement.  We gave her an injection today.  Diagnosis osteoarthritis of the knee

## 2010-08-14 NOTE — H&P (Signed)
NAME:  Kiara [name], Kiara [name]                            ACCOUNT NO.:  [id number]   MEDICAL RECORD NO.:  [id number]                   PATIENT TYPE:  INP   LOCATION:  A318                                 FACILITY:  [facility]   PHYSICIAN:  Jerolyn Shin [name]. Katrinka Blazing, M.D.                DATE OF BIRTH:  06-11-1940   DATE OF ADMISSION:  02/13/2003  DATE OF DISCHARGE:                                HISTORY & PHYSICAL   HISTORY:  This is a 70 year old female who presented to the office with a  diffuse rash on her lower extremities that had been present for many years.  The rash is more severe than it has been ever.  It totally involves her feet  and ankles and was totally confluent in the lower leg and extended up above  the knee.  She had some pain but did not have any itching.  The patient also  complained of chest pain that had been occurring over the past week.  She  has pressure-like pain in the center of her chest.  She denied fever,  chills, nausea.  She denied increasing shortness of breath.  She did not  have cough.  The patient also complained of myalgias and headache.  Because  of these symptoms she is admitted for evaluation and treatment.   PAST HISTORY:  1. She has a history of systemic lupus.  2. She also has leukocytoclastic vasculitis, confirmed by biopsy in 2002.  3. She has chronic anxiety with depression.  4. She has a history of panic disorder.  5. 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  6. Irritable bowel syndrome.  7. Sleep apnea.   FAMILY HISTORY:  Positive for colon cancer in her mother.   SURGERY:  1. Partial thyroidectomy.  2. Hysterectomy.  3. Laminectomy x3.   SOCIAL HISTORY:  She lives alone.  She is medically disabled.  She smoked  two packs of cigarettes a day for many years but quit about five years ago.  She does not drink or use drugs.   ALLERGIES:  She has a reaction to ALL ANTI-INFLAMMATORY MEDICATIONS  including NONSTEROIDALS AND STEROIDS.   PRESENT MEDICATIONS:  1. Lorcet Plus 7.5 mg q.i.d.  2. Elavil 50 mg, 1 or 2 q.h.s.  3. Xanax 1 mg three times daily.  4. Atacand 16/12.5 mg daily.  5. Lexapro 10 mg daily.   REVIEW OF SYSTEMS:  Positive for alternating constipation and diarrhea,  anorexia, and dry mouth.   PHYSICAL EXAMINATION:  VITAL SIGNS:  Blood pressure 110/70, pulse 80,  respirations 18.  GENERAL:  The patient is severely pale.  She is alert though tearful.  She  is oriented.  HEENT:  Unremarkable.  Mild erythema of the posterior pharynx.  NECK:  Supple.  No thyromegaly, adenopathy, or jugular venous distention.  CHEST:  Clear to auscultation.  No rales, rubs, rhonchi, or wheezes.  HEART:  Regular rate and rhythm.  Without murmur, gallop, or rub.  ABDOMEN:  Soft and nontender.  No masses.  EXTREMITIES:  Unremarkable.  SKIN:  Purpuric rash on bilateral thighs, legs, buttocks, extending down to  the feet and ankles.  She also has an area on her left forearm.  There is no  evidence of inflammation.  RECTAL:  Normal stool, guaiac-negative.   IMPRESSION:  1. Probable acute anemia.  2. Leukoclastic vasculitis with exacerbation.  3. Anxiety disorder.  4. Depression.  5. Panic disorder.  6. Irritable bowel syndrome.  7. Hypertension.  8. Fibromyalgia.   PLAN:  The patient will be admitted.  She will be given IV fluids.  Will  check her anemia panel and a CBC.  She will probably need to be typed and  crossed and transfused.  Will ask Dr. Mariel Sleet to evaluate her, and if the  rash does not improve it will be re-biopsied.     ___________________________________________                                         Dirk Dress Katrinka Blazing, M.D.   LCS/MEDQ  D:  02/16/2003  T:  02/16/2003  Job:  [id number]

## 2010-08-14 NOTE — Discharge Summary (Signed)
NAME:  Kiara [name], Kiara [name]                              ACCOUNT NO.:  [id number]   MEDICAL RECORD NO.:  [id number]                  PATIENT TYPE:   LOCATION:                                       FACILITY:   PHYSICIAN:  Jerolyn Shin C. Katrinka Blazing, M.D.                DATE OF BIRTH:   DATE OF ADMISSION:  02/13/2003  DATE OF DISCHARGE:  02/20/2003                                 DISCHARGE SUMMARY   DISCHARGE DIAGNOSES:  1. Acute pneumonitis.  2. Acute exacerbation of leukocytoclastic vasculitis.  3. Acute anemia.  4. Chronic thalassemia.  5. Anxiety disorder.  6. Depression.  7. Irritable bowel syndrome.  8. Hypertension.  9. Fibromyalgia.   DISPOSITION:  The patient is discharged home in stable condition.  She will  have follow up in the office in two weeks.  She will have a p.r.n. followup  with Dr. Mariel Sleet at his request.   MEDICATIONS:  The only medication change was Biaxin 500 mg b.i.d.  She will  continue on her baseline medications which are:  1. Lorcet Plus 7.5 mg q.i.d.  2. Elavil 50 mg 1-2 q.h.s.  3. Xanax 1 mg t.i.d.  4. Atacand 16/12.5 mg daily.  5. Lexapro 10 mg daily.   SUMMARY:  A 70 year old female who presented to the office with a diffuse  rash on her lower extremity that has been present for many years.  The rash  became more severe and had involved her feet and ankles and was totally  confluent in the lower legs extending up above the knee.  She did not have  itching or pain.  She also complained of chest pain that occurred over the  past week.  She complained of pressure-like sensation in her chest.  She  denied increasing shortness of breath or cough.  She also complained of  myalgias and headaches.  Because of this symptom complex, she was admitted.  The patient had history of systemic lupus, leukocytoclastic vasculitis,  chronic anxiety depression, panic disorder, alpha-thalassemia, irritable  bowel syndrome and sleep apnea.   PHYSICAL EXAMINATION:  VITAL SIGNS:   Stable.  Afebrile.  GENERAL APPEARANCE:  She was extremely pale.  LUNGS:  Clear.  HEART:  Unremarkable.  SKIN:  She had a purpuric rash of her thighs, legs buttocks extending down  to her feet and ankles and an area on her left forearm without any  inflammation __________ .   HOSPITAL COURSE:  The patient was admitted, started on IV fluids  and CBC  was checked.  She mainly became febrile.  Her hemoglobin on admission was  7.9.  She was therefore transfused two units of packed cells.  Consultation  was obtained from Dr. Mariel Sleet.  After transfusion, the patient felt much  better.  She had a peak temperature of 103.6 on November 19.  Chest x-ray  showed right upper lobe infiltrate.  White count increased to  15,800.  During this time, rash was resolving, and by the time she was evaluated by  Dr. Mariel Sleet, the rash had resolved.  The reason for this is unknown.  With  treatment, her white count came down to normal.  Her lungs were clear on  exam by November 22.  Chest x-ray showed resolving pneumonitis.  She  remained stable from that point on and was subsequently discharged.  She did  not have cough, chest pain or shortness of breath, and she was afebrile at  the time of discharge.     ___________________________________________                                         Dirk Dress. Katrinka Blazing, M.D.   LCS/MEDQ  D:  03/31/2003  T:  04/01/2003  Job:  [id number]

## 2010-08-14 NOTE — Consult Note (Signed)
[facility]  Patient:    Kiara [name], Kiara [name] Visit Number: [id number] MRN: [id number]          Service Type: RHE Location: [facility] Attending Physician:  Aundra Dubin Dictated by:   Nathaneil Canary, M.D. Proc. Date: 09/05/01 Admit Date:  09/05/2001   CC:         Elpidio Anis, M.D.   Consultation Report  CHIEF COMPLAINT:  Leukocytoclastic vasculitis.  HISTORY OF PRESENT ILLNESS:  Ms. Dockstader returns today reporting that she has had several episodes of various problems to occur.  She describes tightness to her chest like a band after she has used an inhaler.  She had about a two week episode where both feet were swollen but she never sought medical help and this improved.  She describes hurting in the left axilla and a swollen wrist after eating ginger molasses cookies.  She has had a while episode of things to occur.  She says that some of the blood blisters have occurred to the feet, but not severely.  She has been using the Allegra, but says that she can not tell if this has helped or not with these areas.  Laboratories from July 13, 2001 showed a negative hepatitis C and B antibodies.  Her ESR was 30.  She had the known low hemoglobin at 9.6 and an MCV 62, platelets 350,000, WBC 7.3.  She had a normal CMET except for mildly elevated glucose at 136.  Her weight is up 4 pounds.  There has been no fever.  MEDICINES:  1. Xanax 0.1 mg h.s. p.r.n.  2. Elavil 50 mg h.s.  3. Atacand 16/12.5 mg q.d.  4. Lorcet Plus one q.d.-b.i.d. p.r.n.  5. Clarinex 5 mg rare.  6. Allegra 60 mg b.i.d.  7. Tagamet 200 mg q.d.  8. B complex.  9. Fish oil. 10. Triamcinolone ointment p.r.n.  PHYSICAL EXAMINATION  VITAL SIGNS:  Weight 182 pounds, blood pressure 120/68, respirations 18.  GENERAL:  No distress.  SKIN:  She has some mild faded points of rash possibly 1-2 mm in diameter and there were 15 of these scattered about the tops of the left foot and slightly up the  ankle and leg bilaterally.  There were not itching or scabbed.  There was a slight nonspecific rash that was macular with about eight dots different from the ankle that was not scabbed.  There is no malar rash.  LUNGS:  Clear.  NECK:  Negative JVD.  HEART:  Regular.  MUSCULOSKELETAL:  She has minor degenerative changes to the index DIPs, otherwise the hands were cool and nontender.  Wrists, elbows, shoulders, neck, knees, ankles, and feet had a good range of motion and showed no synovitis. There was no lower extremity edema.  ASSESSMENT AND PLAN: 1. Leukocytoclastic vasculitis.  She has had a biopsy showing this.  The faded    areas on her feet could have been fairly recent activity.  She will    continue on the Allegra 60 mg b.i.d. if she feels that this is helping.  It    is very difficult to put all these symptoms together with a clear    diagnosis.  At this time I am seeing no swelling as I did two months ago. 2. Depression.  At points during the interview she spontaneously was crying.    She feels that she is depressed but when offered a depression medicine she    says that she is on Elavil and would prefer  continuing with this. 3. 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 4. Thalassemia.  She will return in four months.  Time:  25 minutes Dictated by:   Nathaneil Canary, M.D. Attending Physician:  Aundra Dubin DD:  09/05/01 TD:  09/07/01 Job: [id number]

## 2010-08-14 NOTE — Op Note (Signed)
NAME:  Kiara [name], Kiara [name]                            ACCOUNT NO.:  [id number]   MEDICAL RECORD NO.:  [id number]                   PATIENT TYPE:  AMB   LOCATION:  DAY                                  FACILITY:  [facility]   PHYSICIAN:  J. Darreld Mclean, M.D.              DATE OF BIRTH:  1940/10/05   DATE OF PROCEDURE:  DATE OF DISCHARGE:                                 OPERATIVE REPORT   PREOPERATIVE DIAGNOSIS:  Tear medial meniscus, left knee.   POSTOPERATIVE DIAGNOSIS:  Tear medial meniscus, left knee.   OPERATION/PROCEDURE:  Left knee operative arthroscopy, partial medial  meniscectomy, left knee.   ANESTHESIA:  General.   SURGEON:  J. Darreld Mclean, M.D.   ASSISTANT:  Candace Cruise, P.A.   TOURNIQUET TIME:  19 minutes.   DRAINS:  None.   INDICATIONS:  The patient had pain and tenderness in the left knee for some  time.  MRI shows tear of the left knee medial meniscus.  She had giving away  with pain.  Surgery has been delayed because the patient has underlying  thalassemia.  I had her see Dr. Mariel Sleet, hematologist, concerning this.  He felt she was able to undergo the procedure.  She has type A thalassemia.   FINDINGS:  The suprapatellar pouch was tight with mild synovitis.  Suprapatella had grade 2 changes, medial was grade 2 to grade 3 changes to  the plateau with one area of grade 4 changes.  There was a tear of the  posterior horn of the medial meniscus.  Anterior cruciate was intact.  Laterally there was some fraying but no tear, grade 2 changes.   DESCRIPTION OF PROCEDURE:  The patient was placed supine on the operating  table and general anesthesia was given.  Tourniquet was placed and inflated  on the left upper thigh and light cautery attached.  The patient was prepped  and draped in the usual manner.  We identified we were doing Ms. Markman on the  left knee.  Esmarch bandage was used and the leg was elevated.  Tourniquet  inflated to 300 mmHg and Esmarch bandage  removed.  Inflow cannula was  inserted medially.  Lactated Ringer's was __________.  Arthroscope was  inserted laterally.  Pictures were taken.  Knee was systemically examined.  Attention was directed to the medial side using meniscal shaver and meniscal  punch.  Meniscus was removed in this area where the tear was.  Again  pictures were taken.  The knee was systemically examined and no pathology  was found.  The wounds were reapproximated using 3-0 nylon interrupted  vertical mattress manner.  Marcaine 0.25% was instilled in each portal.  Tourniquet was deflated after 19 minutes.  Sterile dressing applied, bulky  dressing applied and knee immobilizer applied.  The patient tolerated the  procedure  well and sent to the recovery room.  She will see me in  approximately 10 days to two weeks.  Physical therapy has  been arranged.  If she has any difficulty, she is to contact me through the  office or hospital beeper system.                                               Teola Bradley, M.D.    JWK/MEDQ  D:  06/26/2002  T:  06/26/2002  Job:  [id number]

## 2010-08-27 ENCOUNTER — Other Ambulatory Visit (HOSPITAL_COMMUNITY): Payer: Self-pay | Admitting: Urology

## 2010-08-27 DIAGNOSIS — N390 Urinary tract infection, site not specified: Secondary | ICD-10-CM

## 2010-08-27 DIAGNOSIS — R39198 Other difficulties with micturition: Secondary | ICD-10-CM

## 2010-08-31 ENCOUNTER — Ambulatory Visit (HOSPITAL_COMMUNITY)
Admission: RE | Admit: 2010-08-31 | Discharge: 2010-08-31 | Disposition: A | Discharge status: Home / Self Care | Payer: Medicare Other | Source: Ambulatory Visit | Attending: Urology | Admitting: Urology

## 2010-08-31 DIAGNOSIS — N390 Urinary tract infection, site not specified: Secondary | ICD-10-CM | POA: Insufficient documentation

## 2010-08-31 DIAGNOSIS — N289 Disorder of kidney and ureter, unspecified: Secondary | ICD-10-CM | POA: Insufficient documentation

## 2010-11-05 ENCOUNTER — Ambulatory Visit (INDEPENDENT_AMBULATORY_CARE_PROVIDER_SITE_OTHER): Payer: Medicare Other | Admitting: Orthopedic Surgery

## 2010-11-05 ENCOUNTER — Encounter: Payer: Self-pay | Admitting: Orthopedic Surgery

## 2010-11-05 DIAGNOSIS — M67919 Unspecified disorder of synovium and tendon, unspecified shoulder: Secondary | ICD-10-CM | POA: Insufficient documentation

## 2010-11-05 DIAGNOSIS — M719 Bursopathy, unspecified: Secondary | ICD-10-CM | POA: Insufficient documentation

## 2010-11-05 MED ORDER — METHYLPREDNISOLONE ACETATE 40 MG/ML IJ SUSP: 40.0000 mg | Freq: Once | INTRAMUSCULAR | Status: DC

## 2010-11-05 NOTE — Progress Notes (Signed)
Reconsult LEFT shoulder pain  70 year old female with history of cervical disc disease with a disc bulge at C2 and 3 C3 and 4 C5 and 6 and C6 and 7 dated Aug 12, 2008 complaints of pain starting in the midline over cervicothoracic junction radiating across her shoulder down into her upper forearm perhaps C6 distribution.  However she has other complaints of a feeling like I nerve is wall across the arm inability to tolerate her bra strap across the shoulder.  She does not complain of weakness in the LEFT upper extremity and she denies actual numbness and tingling in the hand.  Past Medical History  Diagnosis Date  . Fibromyalgia   . Chronic fatigue   . AC (acromioclavicular) joint bone spurs   . Arthritis   . Sciatica   . Vasculitis   . Rheumatoid arthritis    Past Surgical History  Procedure Date  . Back surgery     3 back surgeries  . Cholecystectomy   . Abdominal hysterectomy   . Anus surgery   . Left knee    Physical Exam(6) GENERAL: normal development   CDV: pulses are normal   Skin: normal  Psychiatric: awake, alert and oriented  Neuro: normal sensation  MSK LEFT shoulder has full forward elevation there is no tenderness.  She is tender in the midline of the cervical thoracic junction.  She has a normal strength test of the supraspinatus tendon.  Her shoulder remained stable.  She has a mildly positive impingement sign.  I reviewed her MRI report from May of 2010.  Details are in the record.   Assessment: Rotator cuff syndrome vs. Cervical disc disease  Recommend injection if improved than pain is coming from the shoulder if no improvement pain is coming from the cervical spine    Plan: If she gets good relief then of course things are okay if not she will need to see a cervical spine specialist   Subacromial Shoulder Injection Procedure Note  Pre-operative Diagnosis: left RC Syndrome  Post-operative Diagnosis: same  Indications: pain   Anesthesia:  ethyl chloride   Procedure Details   Verbal consent was obtained for the procedure. The shoulder was prepped withalcohol and the skin was anesthetized. A 20 gauge needle was advanced into the subacromial space through posterior approach without difficulty  The space was then injected with 3 ml 1% lidocaine and 1 ml of depomedrol. The injection site was cleansed with isopropyl alcohol and a dressing was applied.  Complications:  None; patient tolerated the procedure well.

## 2010-11-05 NOTE — Patient Instructions (Addendum)
You have received a steroid shot. 15% of patients experience increased pain at the injection site with in the next 24 hours. This is best treated with ice and tylenol extra strength 2 tabs every 8 hours. If you are still having pain please call the office.    

## 2011-06-02 ENCOUNTER — Encounter: Payer: Self-pay | Admitting: Orthopedic Surgery

## 2011-06-02 ENCOUNTER — Ambulatory Visit (INDEPENDENT_AMBULATORY_CARE_PROVIDER_SITE_OTHER): Payer: Medicare Other | Admitting: Orthopedic Surgery

## 2011-06-02 VITALS — BP 130/70 | Ht 65.0 in | Wt 179.0 lb

## 2011-06-02 DIAGNOSIS — M25559 Pain in unspecified hip: Secondary | ICD-10-CM

## 2011-06-02 DIAGNOSIS — M25551 Pain in right hip: Secondary | ICD-10-CM | POA: Insufficient documentation

## 2011-06-02 NOTE — Patient Instructions (Addendum)
CONSULT DR ROY FOR BURNING RIGHT LOWER EXTREMITY PAIN   SEE DR Sudie Bailey FOR MEDICATION

## 2011-06-02 NOTE — Progress Notes (Signed)
Patient ID: Kiara [name], female   DOB: 1940/07/26, 71 y.o.   MRN: [id number] Chief Complaint  Patient presents with  . Hip Pain    right hip pain, gradual onset over several years, no known injury     Subjective:    Kiara [name] is a 71 y.o. female who presents with The following portions of the patient's history were reviewed and updated as appropriate: allergies, current medications, past family history, past medical history, past social history, past surgical history and problem list.   The patient complains of burning pain in her RIGHT hip, which is constant, 7/10, worse with sleeping on her RIGHT side, and relief. Ice and heat. Worse with walking, squatting. She denies any groin or anterior thigh pain. She does have a history of lumbar disc disease and is status post 3 surgeries.  She comes to [place] in consultation at the request of Dr. Sudie Bailey.  Review of systems see scanned doc all positive   Vital signs are stable as recorded  General appearance is normal  The patient is alert and oriented x3  The patient's mood and affect are normal  Gait assessment: reasonable normal   The cardiovascular exam reveals normal pulses and temperature without edema swelling.  The lymphatic system is negative for palpable lymph nodes  The sensory exam is normal.  There are no pathologic reflexes.  Balance is normal.   Exam of the RIGHT hip Inspection Tenderness over the greater trochanter. Mild tenderness over the LEFT greater trochanter as well. Range of motion Full range of motion no pain Stability Normal Strength Normal Skin Normal   Assessment:    hip pain. No evidence of arthritis, most likely with her pain from her back    Plan:    referral to neurosurgery

## 2011-06-07 ENCOUNTER — Other Ambulatory Visit: Payer: Self-pay | Admitting: *Deleted

## 2011-06-07 DIAGNOSIS — Z8739 Personal history of other diseases of the musculoskeletal system and connective tissue: Secondary | ICD-10-CM

## 2011-07-07 ENCOUNTER — Telehealth: Payer: Self-pay | Admitting: Orthopedic Surgery

## 2011-07-07 NOTE — Telephone Encounter (Signed)
Patient is inquiring about the referral appointment to neurosurgeon (Dr. Channing Mutters)?  Referral had been sent after her visit here on 06/02/11.  Please advise patient.  Phone # is [phone].

## 2011-07-19 NOTE — Telephone Encounter (Signed)
This is the patient that Dr Channing Mutters would not see without certain imaging done. Do I need to refer her to someone else or will we be ordering imaging on her?

## 2011-07-19 NOTE — Telephone Encounter (Signed)
Please discuss details with me on Tuesday

## 2011-07-23 NOTE — Telephone Encounter (Signed)
Patient is aware Dr Romeo Apple advised to return to her primary care doctor

## 2011-08-05 ENCOUNTER — Ambulatory Visit (INDEPENDENT_AMBULATORY_CARE_PROVIDER_SITE_OTHER): Payer: Medicare Other | Admitting: Orthopedic Surgery

## 2011-08-05 ENCOUNTER — Encounter: Payer: Self-pay | Admitting: Orthopedic Surgery

## 2011-08-05 VITALS — BP 110/62 | Ht 65.0 in | Wt 179.0 lb

## 2011-08-05 DIAGNOSIS — M76899 Other specified enthesopathies of unspecified lower limb, excluding foot: Secondary | ICD-10-CM

## 2011-08-05 DIAGNOSIS — M1710 Unilateral primary osteoarthritis, unspecified knee: Secondary | ICD-10-CM

## 2011-08-05 DIAGNOSIS — M179 Osteoarthritis of knee, unspecified: Secondary | ICD-10-CM | POA: Insufficient documentation

## 2011-08-05 DIAGNOSIS — M7052 Other bursitis of knee, left knee: Secondary | ICD-10-CM | POA: Insufficient documentation

## 2011-08-05 DIAGNOSIS — M7050 Other bursitis of knee, unspecified knee: Secondary | ICD-10-CM | POA: Insufficient documentation

## 2011-08-05 DIAGNOSIS — IMO0002 Reserved for concepts with insufficient information to code with codable children: Secondary | ICD-10-CM

## 2011-08-05 NOTE — Progress Notes (Signed)
Subjective:     Patient ID: Kiara [name], female   DOB: 1940/11/20, 71 y.o.   MRN: [id number] Chief Complaint  Patient presents with  . Follow-up    Recheck left knee.    HPI The patient also complains of back pain and inability to ambulate for any distance.   pain on the medial side of her left knee including the medial joint line and over the bursa. She denies catching locking or giving way but says her left leg feels weak.  Review of Systems There are no red flag signs related to her back pain   Objective:   Physical Exam Range of motion is normal, stability is normal, strength is normal, skin is normal. Mood is normal. Ambulation is normal. She does walk with a flexed pelvic posture Physical exam tenderness over the medial pes bursa. Left knee.    Assessment:     Back pain needs further workup She also wants an injection in her left shoulder  also like injection in her left knee    Plan:     Recommend injected left knee for bursitis and new problem inject me for osteoarthritis old problem set up appointment for back x-ray and left shoulder injection Knee  Injection Procedure Note  Pre-operative Diagnosis: left knee oa  Post-operative Diagnosis: same  Indications: pain  Anesthesia: ethyl chloride   Procedure Details   Verbal consent was obtained for the procedure. Time out was completed.The joint was prepped with alcohol, followed by  Ethyl chloride spray and A 20 gauge needle was inserted into the knee via lateral approach; 4ml 1% lidocaine and 1 ml of depomedrol  was then injected into the joint . The needle was removed and the area cleansed and dressed.  Complications:  None; patient tolerated the procedure well.   Knee  Injection Procedure Note  Pre-operative Diagnosis: left knee Bursitis  Post-operative Diagnosis: same  Indications: pain  Anesthesia: ethyl chloride   Procedure Details   Verbal consent was obtained for the procedure. Time out was  completed.The Skin was prepped with alcohol, followed by  Ethyl chloride spray and A 25 gauge needle was inserted into the pes bursa of the  knee via lateral approach; 4ml 1% lidocaine and 1 ml of depomedrol  was then injected into the joint . The needle was removed and the area cleansed and dressed.  Complications:  None; patient tolerated the procedure well.

## 2011-08-05 NOTE — Patient Instructions (Signed)
You have received a steroid shot. 15% of patients experience increased pain at the injection site with in the next 24 hours. This is best treated with ice and tylenol extra strength 2 tabs every 8 hours. If you are still having pain please call the office.    

## 2011-09-01 ENCOUNTER — Ambulatory Visit (INDEPENDENT_AMBULATORY_CARE_PROVIDER_SITE_OTHER): Payer: Medicare Other

## 2011-09-01 ENCOUNTER — Ambulatory Visit (INDEPENDENT_AMBULATORY_CARE_PROVIDER_SITE_OTHER): Payer: Medicare Other | Admitting: Orthopedic Surgery

## 2011-09-01 ENCOUNTER — Encounter: Payer: Self-pay | Admitting: Orthopedic Surgery

## 2011-09-01 VITALS — BP 122/70 | Ht 65.0 in | Wt 179.0 lb

## 2011-09-01 DIAGNOSIS — M549 Dorsalgia, unspecified: Secondary | ICD-10-CM

## 2011-09-01 DIAGNOSIS — M5137 Other intervertebral disc degeneration, lumbosacral region: Secondary | ICD-10-CM

## 2011-09-01 DIAGNOSIS — M7540 Impingement syndrome of unspecified shoulder: Secondary | ICD-10-CM

## 2011-09-01 DIAGNOSIS — M25819 Other specified joint disorders, unspecified shoulder: Secondary | ICD-10-CM

## 2011-09-01 DIAGNOSIS — M5136 Other intervertebral disc degeneration, lumbar region: Secondary | ICD-10-CM | POA: Insufficient documentation

## 2011-09-01 NOTE — Patient Instructions (Signed)
You have received a steroid shot. 15% of patients experience increased pain at the injection site with in the next 24 hours. This is best treated with ice and tylenol extra strength 2 tabs every 8 hours. If you are still having pain please call the office.   Start Physical therapy for your back  Back Pain, Adult Low back pain is very common. About 1 in 5 people have back pain. The cause of low back pain is rarely dangerous. The pain often gets better over time. About half of people with a sudden onset of back pain feel better in just 2 weeks. About 8 in 10 people feel better by 6 weeks.   CAUSES Some common causes of back pain include:  Strain of the muscles or ligaments supporting the spine.   Wear and tear (degeneration) of the spinal discs.   Arthritis.   Direct injury to the back.  DIAGNOSIS Most of the time, the direct cause of low back pain is not known. However, back pain can be treated effectively even when the exact cause of the pain is unknown. Answering your caregiver's questions about your overall health and symptoms is one of the most accurate ways to make sure the cause of your pain is not dangerous. If your caregiver needs more information, he or she may order lab work or imaging tests (X-rays or MRIs). However, even if imaging tests show changes in your back, this usually does not require surgery. HOME CARE INSTRUCTIONS For many people, back pain returns. Since low back pain is rarely dangerous, it is often a condition that people can learn to manage on their own.    Remain active. It is stressful on the back to sit or stand in one place. Do not sit, drive, or stand in one place for more than 30 minutes at a time. Take short walks on level surfaces as soon as pain allows. Try to increase the length of time you walk each day.   Do not stay in bed. Resting more than 1 or 2 days can delay your recovery.   Do not avoid exercise or work. Your body is made to move. It is not  dangerous to be active, even though your back may hurt. Your back will likely heal faster if you return to being active before your pain is gone.   Pay attention to your body when you  bend and lift. Many people have less discomfort when lifting if they bend their knees, keep the load close to their bodies, and avoid twisting. Often, the most comfortable positions are those that put less stress on your recovering back.   Find a comfortable position to sleep. Use a firm mattress and lie on your side with your knees slightly bent. If you lie on your back, put a pillow under your knees.   Only take over-the-counter or prescription medicines as directed by your caregiver. Over-the-counter medicines to reduce pain and inflammation are often the most helpful. Your caregiver may prescribe muscle relaxant drugs. These medicines help dull your pain so you can more quickly return to your normal activities and healthy exercise.   Put ice on the injured area.   Put ice in a plastic bag.   Place a towel between your skin and the bag.   Leave the ice on for 15 to 20 minutes, 3 to 4 times a day for the first 2 to 3 days. After that, ice and heat may be alternated to reduce pain and  spasms.   Ask your caregiver about trying back exercises and gentle massage. This may be of some benefit.   Avoid feeling anxious or stressed. Stress increases muscle tension and can worsen back pain. It is important to recognize when you are anxious or stressed and learn ways to manage it. Exercise is a great option.  SEEK MEDICAL CARE IF:  You have pain that is not relieved with rest or medicine.   You have pain that does not improve in 1 week.   You have new symptoms.   You are generally not feeling well.  SEEK IMMEDIATE MEDICAL CARE IF:    You have pain that radiates from your back into your legs.   You develop new bowel or bladder control problems.   You have unusual weakness or numbness in your arms or legs.    You develop nausea or vomiting.   You develop abdominal pain.   You feel faint.  Document Released: 03/15/2005 Document Revised: 03/04/2011 Document Reviewed: 08/03/2010  [facility] Patient Information 2012 [place], [place].  Chronic Back Pain When back pain lasts longer than 3 months, it is called chronic back pain. This pain can be frustrating, but the cause of the pain is rarely dangerous. People with chronic back pain often go through certain periods that are more intense (flare-ups). CAUSES Chronic back pain can be caused by wear and tear (degeneration) on different structures in your back. These structures may include bones, ligaments, or discs. This degeneration may result in more pressure being placed on the nerves that travel to your legs and feet. This can lead to pain traveling from the low back down the back of the legs. When pain lasts longer than 3 months, it is not unusual for people to experience anxiety or depression. Anxiety and depression can also contribute to low back pain. TREATMENT  Establish a regular exercise plan. This is critical to improving your functional level.   Have a self-management plan for when you flare-up. Flare-ups rarely require a medical visit. Regular exercise will help reduce the intensity and frequency of your flare-ups.   Manage how you feel about your back pain and the rest of your life. Anxiety, depression, and feeling that you cannot alter your back pain have been shown to make back pain more intense and debilitating.   Medicines should never be your only treatment. They should be used along with other treatments to help you return to a more active lifestyle.   Procedures such as injections or surgery may be helpful but are rarely necessary. You may be able to get the same results with physical therapy or chiropractic care.  HOME CARE INSTRUCTIONS  Avoid bending, heavy lifting, prolonged sitting, and activities which make the problem worse.    Continue normal activity as much as possible.   Take brief periods of rest throughout the day to reduce your pain during flare-ups.   Follow your back exercise rehabilitation program. This can help reduce symptoms and prevent more pain.   Only take over-the-counter or prescription medicines as directed by your caregiver. Muscle relaxants are sometimes prescribed. Narcotic pain medicine is discouraged for long-term pain, since addiction is a possible outcome.   If you smoke, quit.   Eat healthy foods and maintain a recommended body weight.  SEEK IMMEDIATE MEDICAL CARE IF:    You have weakness or numbness in one of your legs or feet.   You have trouble controlling your bladder or bowels.   You develop nausea, vomiting, abdominal  pain, shortness of breath, or fainting.  Document Released: 04/22/2004 Document Revised: 03/04/2011 Document Reviewed: 02/27/2011 [facility] Patient Information 2012 [place], [place].

## 2011-09-01 NOTE — Progress Notes (Signed)
Patient ID: Kiara [name], female   DOB: 19-Oct-1940, 71 y.o.   MRN: [id number] Chief Complaint  Patient presents with  . Follow-up    recheck left knee, inject left shoulder, xray back    BP 122/70  Ht 5\' 5"  (1.651 m)  Wt 179 lb (81.194 kg)  BMI 29.79 kg/m2  The patient comes in today for an injection of her left shoulder and an x-ray of her lumbar spine for chronic back pain  Back pain will be the focus of today's examination.  She has chronic lower back pain which is occasionally radiating to her right hip but not today. She has no weakness or giving way. She did well from her knee injection  No red flags  Exam painful for elevation of the left shoulder is noted there is no swelling there is mild tenderness over the a.c. joint and the inferior aspect of the acromion in the soft tissues. The shoulder is stable strength is normal there is no weakness in the cuff the skin is intact pulses are good is no lymphadenopathy there is normal sensation. She is awake alert and oriented x3 mood and affect are normal she is ambulatory without assistive device with a slightly altered gait in terms of overall motion and stance phase. General appearance is normal.  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  Subacromial Shoulder Injection Procedure Note  Pre-operative Diagnosis: left RC Syndrome  Post-operative Diagnosis: same  Indications: pain   Anesthesia: ethyl chloride   Procedure Details   Verbal consent was obtained for the procedure. The shoulder was prepped withalcohol and the skin was anesthetized. A 20 gauge needle was advanced into the subacromial space through posterior approach without difficulty  The space was then injected  with 3 ml 1% lidocaine and 1 ml of depomedrol. The injection site was cleansed with isopropyl alcohol and a dressing was applied.  Complications:  None; patient tolerated the procedure well.

## 2011-09-13 ENCOUNTER — Ambulatory Visit (HOSPITAL_COMMUNITY)
Admission: RE | Admit: 2011-09-13 | Discharge: 2011-09-13 | Disposition: A | Discharge status: Home / Self Care | Payer: Medicare Other | Source: Ambulatory Visit | Attending: Orthopedic Surgery | Admitting: Orthopedic Surgery

## 2011-09-13 DIAGNOSIS — M545 Low back pain, unspecified: Secondary | ICD-10-CM | POA: Insufficient documentation

## 2011-09-13 DIAGNOSIS — IMO0001 Reserved for inherently not codable concepts without codable children: Secondary | ICD-10-CM | POA: Insufficient documentation

## 2011-09-13 DIAGNOSIS — M6281 Muscle weakness (generalized): Secondary | ICD-10-CM | POA: Insufficient documentation

## 2011-09-13 DIAGNOSIS — I10 Essential (primary) hypertension: Secondary | ICD-10-CM | POA: Insufficient documentation

## 2011-09-13 DIAGNOSIS — M546 Pain in thoracic spine: Secondary | ICD-10-CM | POA: Insufficient documentation

## 2011-09-13 DIAGNOSIS — M25569 Pain in unspecified knee: Secondary | ICD-10-CM | POA: Insufficient documentation

## 2011-09-13 DIAGNOSIS — M25519 Pain in unspecified shoulder: Secondary | ICD-10-CM | POA: Insufficient documentation

## 2011-09-13 NOTE — Evaluation (Signed)
Physical Therapy Evaluation  Patient Details  Name: Kiara [name] MRN: [id number] Date of Birth: 03-22-1941  Today's Date: 09/13/2011 Time: 1620-1709 PT Time Calculation (min): 49 min Charges 1 eval Visit#: 1  of 8   Re-eval: 10/13/11 Assessment Diagnosis: mid back pain  Next MD Visit: Dr. Romeo Apple - unscheduled Prior Therapy: For knee pain and shoulder pain  Authorization: MEDICARE  Authorization Time Period: Self Care: Current: CL  Goal: CI  Authorization Visit#: 1  of 10    Past Medical History:  Past Medical History  Diagnosis Date  . Fibromyalgia   . Chronic fatigue   . AC (acromioclavicular) joint bone spurs   . Arthritis   . Sciatica   . Vasculitis   . Rheumatoid arthritis    Past Surgical History:  Past Surgical History  Procedure Date  . Back surgery     3 back surgeries  . Cholecystectomy   . Abdominal hysterectomy   . Anus surgery   . Left knee   . Tonsillectomy   . Tubal ligation     Subjective Symptoms/Limitations Symptoms: mid back pain. PMH: L knee surgery (continues to have pain), L shoulder pain, OA, recent diagnosis of scolosis, self reported depression, fibromyalgia, reports of radicular pain to LUE Pertinent History: Pt is refered to PT secondary to LBP which may be related to her a recent diagnosis of scoliosis with significant findings: reports "locking up" in her mid back, pain in her middle of her back, reports cluminess and moderate urinary incontience How long can you sit comfortably?: no difficulty How long can you stand comfortably?: less than 30 minutes than she starts to have pain in her hips.  How long can you walk comfortably?: less than 5 minutes.  Special Tests: + cervical distraction (has home traction unit) Patient Stated Goals: "I want to get back to being able to stand longer so that I can continue with cooking".  Pain Assessment Currently in Pain?: Yes Pain Score:   5 (1-10/10) Pain Location: Thoracic Pain Orientation:  Mid;Right;Left Pain Type: Chronic pain Pain Radiating Towards: BLE intermittent which she explains is worse with constipation.  Pain Relieving Factors: sitting with legs elevated  Effect of Pain on Daily Activities: unable to stand to cook for long periods of time.   Prior Functioning  Home Living Lives With: Alone Prior Function Leisure: Hobbies-yes (Comment) Comments: She loves cooking and trying new recipes, has 3 children  Cognition/Observation Observation/Other Assessments Observations: slight scolotic curve, scapular dyskinesis  Sensation/Coordination/Flexibility/Functional Tests Coordination Gross Motor Movements are Fluid and Coordinated: No Coordination and Movement Description: impaired to TrA with significant compensation to activate TrA musculature Functional Tests Functional Tests: ODI: 62%  Assessment RLE Strength RLE Overall Strength Comments: all tested in seated postion; hip extension by observation is weak due to decreased hip extension with ambulation Right Hip Flexion: 3+/5 Right Hip ABduction: 4/5 Right Hip ADduction: 4/5 Right Knee Flexion: 5/5 Right Knee Extension: 4/5 LLE Strength LLE Overall Strength Comments: hip extension demonstrates functional weakness with gait Left Hip Flexion: 3/5 Left Hip ABduction: 4/5 Left Hip ADduction: 4/5 Left Knee Flexion: 4/5 Left Knee Extension: 4/5 Lumbar AROM Lumbar Flexion: WNL  Lumbar Extension: WNL - mild pain Lumbar - Right Side Bend: WNL Lumbar - Left Side Bend: WNL Lumbar - Right Rotation: WNL - mild pain to low back Lumbar - Left Rotation: WNL - mild pain to low back Palpation Palpation: significant decrease in PA mobility from T7-T4 with R muscular spasms noted.  Mobility/Balance  Posture/Postural Control Posture/Postural Control: Postural limitations Postural Limitations: upper cross syndrome Static Standing Balance Single Leg Stance - Right Leg: 5  (w/decreased functional hip  strength) Single Leg Stance - Left Leg: 5  Tandem Stance - Right Leg: 10  Tandem Stance - Left Leg: 10  Rhomberg - Eyes Opened: 10  Rhomberg - Eyes Closed: 10    Exercise/Treatments Seated Other Seated Lumbar Exercises: Sitting with proper posture and diaphragmatic breathing while completing heel and toe roll in and outs 2x10 sec holds  Manual Therapy Manual Therapy: Joint mobilization Joint Mobilization: Grade I-II to T4-7 PA to improve mobility and decrease pain.    Physical Therapy Assessment and Plan PT Assessment and Plan Clinical Impression Statement: Pt is a 71 year old female referred to PT for mid back and low back pain which are severly limiting her with her favorite hobby of cooking.  Pt reports decreased pain at end of treatment.  Recommend OT services to address radicular pain to L UE. Pt will benefit from skilled therapeutic intervention in order to improve on the following deficits: Decreased balance;Increased fascial restricitons;Increased muscle spasms;Impaired flexibility;Improper body mechanics;Impaired perceived functional ability;Decreased strength;Decreased coordination;Decreased mobility Rehab Potential: Good PT Frequency: Min 2X/week PT Duration: 4 weeks PT Treatment/Interventions: Stair training;Therapeutic exercise;Balance training;Neuromuscular re-education;Functional mobility training;Patient/family education;Therapeutic activities;Manual techniques;Modalities PT Plan: Start with thoracic mobilizations to decrease pain and improve mobility.  then UBE backwards for posture.  f/u on heel/toe in and outs, add core strengthening.     Goals Home Exercise Program Pt will Perform Home Exercise Program: Independently PT Goal: Perform Home Exercise Program - Progress: Goal set today PT Short Term Goals Time to Complete Short Term Goals: 2 weeks PT Short Term Goal 1: Pt will report pain range 3-5/10.   PT Short Term Goal 2: Pt will improve core strength to tolerate  standing for 30 minutes to continue with cooking activities.  PT Short Term Goal 3: Pt will improve LE strength by 1/2 muscle grade.  PT Short Term Goal 4: Pt will improve her L and R SLS on static surface x10 sec with appropriate mechanics.  PT Long Term Goals Time to Complete Long Term Goals: 4 weeks PT Long Term Goal 1: Pt will improve core strength in order to tolerate standing for 45 minutes to continue with cooking activities.  PT Long Term Goal 2: Pt will improve her ODI to less than 52% for improved QOL.   Long Term Goal 3: Pt will improve her LE strength to tolerate walking for 15 minutes in order to continue with community mobility.  Long Term Goal 4: Pt will improve her flexibility to demonstrate appropraite mechanics to bend and squat with decreased pain.  PT Long Term Goal 5: Pt will rate mid back pain less than 2/10 for 75% of her day and her low back pain less than 3/10 for 75% of her day.  Problem List Patient Active Problem List  Diagnosis  . ANEMIA  . ANXIETY DISORDER  . TOBACCO ABUSE  . DEPRESSION  . HYPERTENSION  . DEGENERATIVE DISC DISEASE, CERVICAL SPINE  . OTH SPEC D/O ROTATOR CUFF SYND SHLDR&ALLIED D/O  . IMPINGEMENT SYNDROME  . FIBROMYALGIA  . OTHER MALAISE AND FATIGUE  . CHEST PAIN  . IRRITABLE BOWEL SYNDROME, HX OF  . Disorders of bursae and tendons in shoulder region, unspecified  . Hip pain, right  . Arthritis of knee  . Knee bursitis, left  . Knee bursitis  . Arthritis of knee, degenerative  .  Back pain  . Shoulder impingement  . DDD (degenerative disc disease), lumbar  . Lumbago  . Pain in thoracic spine  . Muscle weakness (generalized)    PT - End of Session Activity Tolerance: Patient tolerated treatment well PT Plan of Care PT Home Exercise Plan: see scanned report( only instruction on heel and toe roll in and outs w/proper posture) PT Patient Instructions: education on importance of posture and core musculature in the role to decrease  back pain.  Consulted and Agree with Plan of Care: Patient  GP  Functional Reporting Modifier  Current Status  [id number] - Self Care CL - At least 60% but less than 80% impaired, limited or restricted  Goal Status  [id number] - Self Care CI - At least 1% but less than 20% impaired, limited or restricted   Kristian Mogg 09/13/2011, 5:44 PM  Physician Documentation Your signature is required to indicate approval of the treatment plan as stated above.  Please sign and either send electronically or make a copy of this report for your files and return this physician signed original.   Please mark one 1.__approve of plan  2. ___approve of plan with the following conditions.   ______________________________                                                          _____________________ Physician Signature                                                                                                             Date

## 2011-09-16 ENCOUNTER — Ambulatory Visit (HOSPITAL_COMMUNITY)
Admission: RE | Admit: 2011-09-16 | Discharge: 2011-09-16 | Disposition: A | Discharge status: Home / Self Care | Payer: Medicare Other | Source: Ambulatory Visit | Attending: Family Medicine | Admitting: Family Medicine

## 2011-09-16 NOTE — Progress Notes (Signed)
Physical Therapy Treatment Patient Details  Name: Kiara [name] MRN: [id number] Date of Birth: [birth year]  Today's Date: 09/16/2011 Time: 1302-1342 PT Time Calculation (min): 40 min  Visit#: 2  of 8   Re-eval: 10/13/11    Authorization:    Authorization Time Period: Current CL Goal is CI  Authorization Visit#: 2  of 10    Subjective: Symptoms/Limitations Symptoms: Ms. Kisamore states that she started to use her cane due to the fact that she leans over so much if she does not use it.  Pt has been trying to do her exercises. Pain Assessment Pain Score:   5 Pain Location: Back Pain Orientation: Mid    Exercise/Treatments      Aerobic UBE (Upper Arm Bike): 4'@ 2.0 backward   Seated Other Seated Lumbar Exercises: W-back, ab set, Keagal x 10; roll in and out x 5  Manual Therapy Manual Therapy: Joint mobilization Joint Mobilization: massage and joint mob for T1-T7   Physical Therapy Assessment and Plan PT Assessment and Plan Clinical Impression Statement: Pt shows carry over when trying to complete exercises.  Verbal cuing needed to prevent pt from holding her breath.  Added new stab exercises  PT Plan: review ex begin t-band exercises next treatment to improve back strength.    Goals    Problem List Patient Active Problem List  Diagnosis  . ANEMIA  . ANXIETY DISORDER  . TOBACCO ABUSE  . DEPRESSION  . HYPERTENSION  . DEGENERATIVE DISC DISEASE, CERVICAL SPINE  . OTH SPEC D/O ROTATOR CUFF SYND SHLDR&ALLIED D/O  . IMPINGEMENT SYNDROME  . FIBROMYALGIA  . OTHER MALAISE AND FATIGUE  . CHEST PAIN  . IRRITABLE BOWEL SYNDROME, HX OF  . Disorders of bursae and tendons in shoulder region, unspecified  . Hip pain, right  . Arthritis of knee  . Knee bursitis, left  . Knee bursitis  . Arthritis of knee, degenerative  . Back pain  . Shoulder impingement  . DDD (degenerative disc disease), lumbar  . Lumbago  . Pain in thoracic spine  . Muscle weakness (generalized)    PT  - End of Session Activity Tolerance: Patient tolerated treatment well General Behavior During Session: [facility] for tasks performed Cognition: [facility] for tasks performed PT Plan of Care PT Home Exercise Plan: given new ex sheet with added ex  GP No functional reporting required  Alden Bensinger,CINDY 09/16/2011, 1:55 PM

## 2011-09-20 ENCOUNTER — Ambulatory Visit (HOSPITAL_COMMUNITY)
Admission: RE | Admit: 2011-09-20 | Discharge: 2011-09-20 | Disposition: A | Discharge status: Home / Self Care | Payer: Medicare Other | Source: Ambulatory Visit | Attending: Orthopedic Surgery | Admitting: Orthopedic Surgery

## 2011-09-20 NOTE — Progress Notes (Signed)
Physical Therapy Treatment Patient Details  Name: BETSIE PECKMAN MRN: [id number] Date of Birth: 12-04-1940  Today's Date: 09/20/2011 Time: 7829-5621 PT Time Calculation (min): 44 min Charges: 10' NMR, 59' TE Visit#: 3  of 8   Re-eval: 10/13/11    Authorization: MEDICARE  Authorization Time Period: Current CL Goal is CI  Authorization Visit#: 3  of 10    Subjective: Symptoms/Limitations Symptoms: Reports that she is uncomfortable all over and feels like she is trembling.  She states she is having difficulty remembering her exercises correctly.  Pain Assessment Currently in Pain?: Yes Pain Location: Back Pain Orientation: Mid;Lower  Exercise/Treatments Stretches Quadruped Mid Back Stretch: 3 reps;30 seconds Standing Row: Both;10 reps;Theraband Theraband Level (Row): Level 4 (Blue) Shoulder Extension: Both;10 reps;Theraband Theraband Level (Shoulder Extension): Level 4 (Blue) Seated Other Seated Lumbar Exercises: Heel and Toe 10x10sec hold each, w/instruction for diaphragmatic breathing  Other Seated Lumbar Exercises: W-back x10 Supine Ab Set: 10 reps;Other (comment);Limitations (10 sec holds) AB Set Limitations: NMR to establish appropriate contraction to R TrA Bridge: 15 reps Other Supine Lumbar Exercises: Kegel x10  5sec holds w/cueing for motivation  Manual Therapy Manual Therapy: Joint mobilization Joint Mobilization: Grade II-III PA to SP of T3-T12, Grade II-III PA to R T12 rib   Physical Therapy Assessment and Plan PT Assessment and Plan Clinical Impression Statement: Continues to require mod to max cueing for all activities for HEP.  Added T-band standing exercises for back strengthening.  Pt leaves with improved posture and decreased fatigue.   PT Plan: Continue to progres core stengthening and postural activities; f/u on T-band activities, provide T-band w/exercises if pt tolerated well.     Goals    Problem List Patient Active Problem List  Diagnosis  .  ANEMIA  . ANXIETY DISORDER  . TOBACCO ABUSE  . DEPRESSION  . HYPERTENSION  . DEGENERATIVE DISC DISEASE, CERVICAL SPINE  . OTH SPEC D/O ROTATOR CUFF SYND SHLDR&ALLIED D/O  . IMPINGEMENT SYNDROME  . FIBROMYALGIA  . OTHER MALAISE AND FATIGUE  . CHEST PAIN  . IRRITABLE BOWEL SYNDROME, HX OF  . Disorders of bursae and tendons in shoulder region, unspecified  . Hip pain, right  . Arthritis of knee  . Knee bursitis, left  . Knee bursitis  . Arthritis of knee, degenerative  . Back pain  . Shoulder impingement  . DDD (degenerative disc disease), lumbar  . Lumbago  . Pain in thoracic spine  . Muscle weakness (generalized)    PT - End of Session Activity Tolerance: Patient tolerated treatment well General Behavior During Session: [facility] for tasks performed Cognition: [facility] for tasks performed  GP No functional reporting required  Kenslie Abbruzzese 09/20/2011, 4:17 PM

## 2011-09-22 ENCOUNTER — Ambulatory Visit (HOSPITAL_COMMUNITY)
Admission: RE | Admit: 2011-09-22 | Discharge: 2011-09-22 | Disposition: A | Discharge status: Home / Self Care | Payer: Medicare Other | Source: Ambulatory Visit | Attending: Orthopedic Surgery | Admitting: Orthopedic Surgery

## 2011-09-22 NOTE — Progress Notes (Signed)
Physical Therapy Treatment Patient Details  Name: Kiara [name] MRN: [id number] Date of Birth: April 07, 1940  Today's Date: 09/22/2011 Time: 1914-7829 PT Time Calculation (min): 43 min Charges: 33' TE, 10; Manual  Visit#: 3  of 8   Re-eval: 10/13/11    Authorization: MEDICARE  Authorization Time Period: Current CL Goal is CI  Authorization Visit#: 3  of 10    Subjective: Symptoms/Limitations Symptoms: Pt reports that she had a bought of vasculitis yesterday, but is ready to work today.  She states she wonders if some of her pain is coming from her liver as she has a PMH of Hep A and it may be flared up again.  "I feel like I reallly worked out last time, but did not have any increased pain."  Objetive: Palpated through RUE without significant findings of liver dysfunction.  Has increased muscular spasms and decreased rib mobility.  Pain Assessment Currently in Pain?: Yes Pain Score:   5 Pain Location: Rib cage Pain Orientation: Right  Exercise/Treatments Standing Row: 20 reps Theraband Level (Row): Level 4 (Blue) Shoulder Extension: 20 reps Theraband Level (Shoulder Extension): Level 4 (Blue) Shoulder ADduction: Both;10 reps;Theraband Theraband Level (Shoulder Adduction): Level 4 (Blue) Seated Other Seated Lumbar Exercises: Heel and Toe 10x10sec hold each, w/instruction for diaphragmatic breathing  Other Seated Lumbar Exercises: W-back x10 Supine Bent Knee Raise: 5 reps;Other (comment) (w/ab set) Bridge: 15 reps Sidelying Clam: 10 reps;Other (comment) (10 sec, BLE)  Manual Therapy Joint Mobilization: w/pt POE, Grade II-III PA to SP of T3-T12, L S/L: Grade II-III PA to R T19-12 rib   Physical Therapy Assessment and Plan PT Assessment and Plan Clinical Impression Statement: Educated patient on proper use of B t-band.  Added mobilizations in POE to improve mobility.  Pt able to complete all activities without an increase in pain.  PT Plan: Continue to progres core stengthening  and postural activities; f/u on T-band activities, provide T-band w/exercises if pt tolerated well.     Goals    Problem List Patient Active Problem List  Diagnosis  . ANEMIA  . ANXIETY DISORDER  . TOBACCO ABUSE  . DEPRESSION  . HYPERTENSION  . DEGENERATIVE DISC DISEASE, CERVICAL SPINE  . OTH SPEC D/O ROTATOR CUFF SYND SHLDR&ALLIED D/O  . IMPINGEMENT SYNDROME  . FIBROMYALGIA  . OTHER MALAISE AND FATIGUE  . CHEST PAIN  . IRRITABLE BOWEL SYNDROME, HX OF  . Disorders of bursae and tendons in shoulder region, unspecified  . Hip pain, right  . Arthritis of knee  . Knee bursitis, left  . Knee bursitis  . Arthritis of knee, degenerative  . Back pain  . Shoulder impingement  . DDD (degenerative disc disease), lumbar  . Lumbago  . Pain in thoracic spine  . Muscle weakness (generalized)    PT - End of Session Activity Tolerance: Patient tolerated treatment well General Behavior During Session: [facility] for tasks performed Cognition: [facility] for tasks performed PT Plan of Care PT Home Exercise Plan: updated w/T-band activities and gave blue T-band Consulted and Agree with Plan of Care: Patient  Kiara [name] 09/22/2011, 4:21 PM

## 2011-09-27 ENCOUNTER — Ambulatory Visit (HOSPITAL_COMMUNITY)
Admission: RE | Admit: 2011-09-27 | Discharge: 2011-09-27 | Disposition: A | Discharge status: Home / Self Care | Payer: Medicare Other | Source: Ambulatory Visit | Attending: Family Medicine | Admitting: Family Medicine

## 2011-09-27 DIAGNOSIS — I10 Essential (primary) hypertension: Secondary | ICD-10-CM | POA: Insufficient documentation

## 2011-09-27 DIAGNOSIS — IMO0001 Reserved for inherently not codable concepts without codable children: Secondary | ICD-10-CM | POA: Insufficient documentation

## 2011-09-27 DIAGNOSIS — M545 Low back pain, unspecified: Secondary | ICD-10-CM | POA: Insufficient documentation

## 2011-09-27 DIAGNOSIS — M6281 Muscle weakness (generalized): Secondary | ICD-10-CM | POA: Insufficient documentation

## 2011-09-27 DIAGNOSIS — M25519 Pain in unspecified shoulder: Secondary | ICD-10-CM | POA: Insufficient documentation

## 2011-09-27 DIAGNOSIS — M546 Pain in thoracic spine: Secondary | ICD-10-CM | POA: Insufficient documentation

## 2011-09-27 DIAGNOSIS — M25569 Pain in unspecified knee: Secondary | ICD-10-CM | POA: Insufficient documentation

## 2011-09-27 NOTE — Progress Notes (Signed)
Physical Therapy Treatment Patient Details  Name: Kiara [name] MRN: [id number] Date of Birth: 09-06-1940  Today's Date: 09/27/2011 Time: 7829-5621 PT Time Calculation (min): 39 min  Visit#: 4  of 8   Re-eval: 10/13/11 Assessment Diagnosis: mid back pain  Next MD Visit: Dr. Romeo Apple - unscheduled Prior Therapy: For knee pain and shoulder pain Charges:  therex 37' Authorization: MEDICARE  Authorization Time Period: Current CL Goal is CI   Authorization Visit#: 4  of 10    Subjective: Symptoms/Limitations Symptoms: Pt. states she had a dizzy spell over the weekend and believes it was because of her new eyedrops.  Pt states she has a chemical sensitivity that affects her balance.  Pt . reports she is currently only having a little discomfort in her thoracici area.. Pain Assessment Currently in Pain?: Yes Pain Score:   5 Pain Location: Thoracic Pain Orientation: Right   Exercise/Treatments Stretches Quadruped Mid Back Stretch: 3 reps;30 seconds;Limitations Quadruped Mid Back Stretch Limitations: seated Standing Row: 20 reps Theraband Level (Row): Level 4 (Blue) Shoulder Extension: 20 reps Theraband Level (Shoulder Extension): Level 4 (Blue) Shoulder ADduction: Both;10 reps;Theraband Theraband Level (Shoulder Adduction): Level 4 (Blue) Seated Other Seated Lumbar Exercises: Heel and Toe 10x10sec hold each, w/instruction for diaphragmatic breathing  Other Seated Lumbar Exercises: W-back x10 Supine Ab Set: 10 reps AB Set Limitations: NMR to establish appropriate contraction to R TrA Bent Knee Raise: 10 reps Bridge: 15 reps Sidelying Clam: 10 reps     Physical Therapy Assessment and Plan PT Assessment and Plan Clinical Impression Statement: Pt. with good form/control with exercise. No complaints with any and able to increase reps of bent knee raise.  PT Plan: Continue to progress and improve core strength.     Problem List Patient Active Problem List  Diagnosis  .  ANEMIA  . ANXIETY DISORDER  . TOBACCO ABUSE  . DEPRESSION  . HYPERTENSION  . DEGENERATIVE DISC DISEASE, CERVICAL SPINE  . OTH SPEC D/O ROTATOR CUFF SYND SHLDR&ALLIED D/O  . IMPINGEMENT SYNDROME  . FIBROMYALGIA  . OTHER MALAISE AND FATIGUE  . CHEST PAIN  . IRRITABLE BOWEL SYNDROME, HX OF  . Disorders of bursae and tendons in shoulder region, unspecified  . Hip pain, right  . Arthritis of knee  . Knee bursitis, left  . Knee bursitis  . Arthritis of knee, degenerative  . Back pain  . Shoulder impingement  . DDD (degenerative disc disease), lumbar  . Lumbago  . Pain in thoracic spine  . Muscle weakness (generalized)    PT - End of Session Activity Tolerance: Patient tolerated treatment well General Behavior During Session: [facility] for tasks performed Cognition: [facility] for tasks performed   Lurena Nida, PTA/CLT 09/27/2011, 3:19 PM

## 2011-10-01 ENCOUNTER — Ambulatory Visit (HOSPITAL_COMMUNITY)
Admission: RE | Admit: 2011-10-01 | Discharge: 2011-10-01 | Disposition: A | Discharge status: Home / Self Care | Payer: Medicare Other | Source: Ambulatory Visit | Attending: Family Medicine | Admitting: Family Medicine

## 2011-10-01 NOTE — Progress Notes (Signed)
Physical Therapy Treatment Patient Details  Name: Kiara [name] MRN: [id number] Date of Birth: 12-Jul-1940  Today's Date: 10/01/2011 Time: 4782-9562 PT Time Calculation (min): 55 min Charges: 30' TE, 15' Manual, 10' Self care Visit#: 5  of 8   Re-eval: 10/13/11    Authorization: MEDICARE  Authorization Time Period: Current CL Goal is CI   Authorization Visit#: 5  of 10    Subjective: Symptoms/Limitations Symptoms: I am taking some antacids which is helping a lot with my pain and the exercises are getting easier.  I am not sure if it is the exercises or the antacids but I am feeling better.  My L knee is really bothering me still.   Pain Assessment Currently in Pain?: Yes Pain Score:   5 Pain Location: Thoracic Pain Orientation: Right  Precautions/Restrictions     Exercise/Treatments Standing Functional Squats: 10 reps Scapular Retraction: Both;5 reps;Theraband Theraband Level (Scapular Retraction): Level 4 (Blue) Row: 20 reps Theraband Level (Row): Level 4 (Blue) Shoulder Extension: 20 reps Theraband Level (Shoulder Extension): Level 4 (Blue) Shoulder ADduction: Both;10 reps;Theraband Theraband Level (Shoulder Adduction): Level 4 (Blue) Other Standing Lumbar Exercises: Elbow Preses 10x10 sec holds Other Standing Lumbar Exercises: Tandem Gait w/min A 1 RT slow and controlled Seated Other Seated Lumbar Exercises: W-back x10; X to V x10 Supine Bent Knee Raise: 5 reps;Other (comment) (on towel roll) Other Supine Lumbar Exercises: Towel Roll stretch x5 minutes  Manual Therapy Manual Therapy: Joint mobilization Joint Mobilization: pt seated: MWM to T7 w/trunk flexion and extension and rotation (R and L)  Grade III PA to T7 SP and L TP to improve mobility and decrease spasms w/STM after.  Manual x15 minutes  Physical Therapy Assessment and Plan PT Assessment and Plan Clinical Impression Statement: Pt had decreased pain and improved thoracic mobility after treatment today.   Continues to display improved posture from cervical to lumbar region w/less cueing required.  Added elbow preses, and thoracic stretch on towel, squats and tandem gait.  PT Plan: Continue to progress and improve core strength.    Goals    Problem List Patient Active Problem List  Diagnosis  . ANEMIA  . ANXIETY DISORDER  . TOBACCO ABUSE  . DEPRESSION  . HYPERTENSION  . DEGENERATIVE DISC DISEASE, CERVICAL SPINE  . OTH SPEC D/O ROTATOR CUFF SYND SHLDR&ALLIED D/O  . IMPINGEMENT SYNDROME  . FIBROMYALGIA  . OTHER MALAISE AND FATIGUE  . CHEST PAIN  . IRRITABLE BOWEL SYNDROME, HX OF  . Disorders of bursae and tendons in shoulder region, unspecified  . Hip pain, right  . Arthritis of knee  . Knee bursitis, left  . Knee bursitis  . Arthritis of knee, degenerative  . Back pain  . Shoulder impingement  . DDD (degenerative disc disease), lumbar  . Lumbago  . Pain in thoracic spine  . Muscle weakness (generalized)    PT - End of Session Activity Tolerance: Patient tolerated treatment well General Behavior During Session: [facility] for tasks performed Cognition: [facility] for tasks performed PT Plan of Care PT Patient Instructions: Discussed with pt importance of posture and possiblity of dizziness coming from cervical region secondary to impaired posture.  Educated to complete thoracic stretch on towel at home on the ground.   GP    Denney Shein 10/01/2011, 3:43 PM

## 2011-10-04 ENCOUNTER — Ambulatory Visit (HOSPITAL_COMMUNITY)
Admission: RE | Admit: 2011-10-04 | Discharge: 2011-10-04 | Disposition: A | Discharge status: Home / Self Care | Payer: Medicare Other | Source: Ambulatory Visit | Attending: Family Medicine | Admitting: Family Medicine

## 2011-10-04 NOTE — Progress Notes (Signed)
Physical Therapy Treatment Patient Details  Name: NATINA WIGINTON MRN: [id number] Date of Birth: Dec 19, 1940  Today's Date: 10/04/2011 Time: 3086-5784 PT Time Calculation (min): 43 min Visit#: 6  of 8   Re-eval: 10/13/11 Charges:  therex 40'    Authorization: MEDICARE  Authorization Time Period: Current CL Goal is CI   Authorization Visit#: 6  of 10    Subjective: Symptoms/Limitations Symptoms: Pt. states she had been getting dizzy lately and discovered by her MD her BP was bottoming out. PT. states her upper back/neck is 4/10 and L knee is 6/10 today Pain Assessment Currently in Pain?: Yes Pain Score:   4 Pain Location: Thoracic Pain Orientation: Right Pain Type: Chronic pain   Exercise/Treatments Stretches Quadruped Mid Back Stretch: 3 reps;30 seconds;Limitations Quadruped Mid Back Stretch Limitations: seated Standing Scapular Retraction: 15 reps Theraband Level (Scapular Retraction): Level 3 (Green) Row: 15 reps Theraband Level (Row): Level 3 (Green) Shoulder Extension: 15 reps Theraband Level (Shoulder Extension): Level 3 (Green) Other Standing Lumbar Exercises: Elbow Preses 15x10 sec holds Other Standing Lumbar Exercises: Tandem Gait w/min A 1 RT slow and controlled Seated Other Seated Lumbar Exercises: Heel and Toe 10x10sec hold each, w/instruction for diaphragmatic breathing  Other Seated Lumbar Exercises: W back 10 reps Supine Ab Set: 10 reps AB Set Limitations: with thoracic towel roll Bent Knee Raise: 10 reps;Limitations Bent Knee Raise Limitations: with thoracic towel roll Other Supine Lumbar Exercises: Towel Roll stretch x5 minutes    Physical Therapy Assessment and Plan PT Assessment and Plan Clinical Impression Statement: Good stretch with towel used in thoracic area while performing exercises in supine.  Noted improvement in posture with decreased dizziness, however pt. reports MD did not adjust/change her meds stating it was age-related.  Pt. states she  has added more protein to her diet and it has helped. PT Plan: Continue to progress and improve core strength     Problem List Patient Active Problem List  Diagnosis  . ANEMIA  . ANXIETY DISORDER  . TOBACCO ABUSE  . DEPRESSION  . HYPERTENSION  . DEGENERATIVE DISC DISEASE, CERVICAL SPINE  . OTH SPEC D/O ROTATOR CUFF SYND SHLDR&ALLIED D/O  . IMPINGEMENT SYNDROME  . FIBROMYALGIA  . OTHER MALAISE AND FATIGUE  . CHEST PAIN  . IRRITABLE BOWEL SYNDROME, HX OF  . Disorders of bursae and tendons in shoulder region, unspecified  . Hip pain, right  . Arthritis of knee  . Knee bursitis, left  . Knee bursitis  . Arthritis of knee, degenerative  . Back pain  . Shoulder impingement  . DDD (degenerative disc disease), lumbar  . Lumbago  . Pain in thoracic spine  . Muscle weakness (generalized)    PT - End of Session Activity Tolerance: Patient tolerated treatment well General Behavior During Session: [facility] for tasks performed Cognition: [facility] for tasks performed   Lurena Nida, PTA/CLT 10/04/2011, 3:12 PM

## 2011-10-07 ENCOUNTER — Ambulatory Visit (HOSPITAL_COMMUNITY)
Admission: RE | Admit: 2011-10-07 | Discharge: 2011-10-07 | Disposition: A | Discharge status: Home / Self Care | Payer: Medicare Other | Source: Ambulatory Visit | Attending: Family Medicine | Admitting: Family Medicine

## 2011-10-07 NOTE — Progress Notes (Signed)
Physical Therapy Treatment Patient Details  Name: Kiara [name] MRN: [id number] Date of Birth: 1940/04/06  Today's Date: 10/07/2011 Time: 4098-1191 PT Time Calculation (min): 41 min Visit#: 7  of 8   Re-eval: 10/13/11 Charges:  therex 24', NMR 15'    Authorization: MEDICARE  Authorization Time Period: Current CL Goal is CI   Authorization Visit#: 7  of 10    Subjective: Symptoms/Limitations Symptoms: Pt. asking if her BP/dizziness could be related to her cholesterol; suggested she speak to her MD about that.  States that her upper back is unchanged. Pain Assessment Currently in Pain?: Yes Pain Score:   4 Pain Location: Thoracic Pain Orientation: Right   Exercise/Treatments Standing Other Standing Lumbar Exercises: SLS R: 15", L:20" max of 3; Elbow Presses 15x10 sec holds Other Standing Lumbar Exercises: Tandem Gait 1RT, retro gait 1RT, side stepping 1RT. Seated Other Seated Lumbar Exercises: Heel and Toe 10x10sec hold each, w/instruction for diaphragmatic breathing  Supine Ab Set: 10 reps AB Set Limitations: with thoracic towel roll Bent Knee Raise: 10 reps;Limitations Bent Knee Raise Limitations: with thoracic towel roll Other Supine Lumbar Exercises: Towel Roll stretch x5 minutes    Physical Therapy Assessment and Plan PT Assessment and Plan Clinical Impression Statement: Added balance activities with good stability overall (required min assist with tandem last time).  Unable to maintain SLS greater than 20 seconds.  PT Plan: Progress core strength and improve balance/stability.  Add vector stance next visit.     Problem List Patient Active Problem List  Diagnosis  . ANEMIA  . ANXIETY DISORDER  . TOBACCO ABUSE  . DEPRESSION  . HYPERTENSION  . DEGENERATIVE DISC DISEASE, CERVICAL SPINE  . OTH SPEC D/O ROTATOR CUFF SYND SHLDR&ALLIED D/O  . IMPINGEMENT SYNDROME  . FIBROMYALGIA  . OTHER MALAISE AND FATIGUE  . CHEST PAIN  . IRRITABLE BOWEL SYNDROME, HX OF  .  Disorders of bursae and tendons in shoulder region, unspecified  . Hip pain, right  . Arthritis of knee  . Knee bursitis, left  . Knee bursitis  . Arthritis of knee, degenerative  . Back pain  . Shoulder impingement  . DDD (degenerative disc disease), lumbar  . Lumbago  . Pain in thoracic spine  . Muscle weakness (generalized)    PT - End of Session Equipment Utilized During Treatment: Gait belt Activity Tolerance: Patient tolerated treatment well General Behavior During Session: [facility] for tasks performed Cognition: [facility] for tasks performed   Lurena Nida, PTA/CLT 10/07/2011, 3:10 PM

## 2011-10-11 ENCOUNTER — Ambulatory Visit (HOSPITAL_COMMUNITY): Payer: Medicare Other | Admitting: Physical Therapy

## 2011-10-14 ENCOUNTER — Ambulatory Visit (HOSPITAL_COMMUNITY): Payer: Medicare Other | Admitting: Physical Therapy

## 2012-01-19 ENCOUNTER — Encounter: Payer: Self-pay | Admitting: Orthopedic Surgery

## 2012-01-19 ENCOUNTER — Ambulatory Visit (INDEPENDENT_AMBULATORY_CARE_PROVIDER_SITE_OTHER): Payer: Medicare Other | Admitting: Orthopedic Surgery

## 2012-01-19 VITALS — BP 110/70 | Ht 65.0 in | Wt 179.0 lb

## 2012-01-19 DIAGNOSIS — M25519 Pain in unspecified shoulder: Secondary | ICD-10-CM

## 2012-01-19 DIAGNOSIS — M545 Low back pain, unspecified: Secondary | ICD-10-CM

## 2012-01-19 DIAGNOSIS — M5030 Other cervical disc degeneration, unspecified cervical region: Secondary | ICD-10-CM

## 2012-01-19 DIAGNOSIS — M25559 Pain in unspecified hip: Secondary | ICD-10-CM

## 2012-01-19 DIAGNOSIS — IMO0002 Reserved for concepts with insufficient information to code with codable children: Secondary | ICD-10-CM

## 2012-01-19 DIAGNOSIS — M5137 Other intervertebral disc degeneration, lumbosacral region: Secondary | ICD-10-CM

## 2012-01-19 DIAGNOSIS — M1710 Unilateral primary osteoarthritis, unspecified knee: Secondary | ICD-10-CM

## 2012-01-19 DIAGNOSIS — M25551 Pain in right hip: Secondary | ICD-10-CM

## 2012-01-19 DIAGNOSIS — F329 Major depressive disorder, single episode, unspecified: Secondary | ICD-10-CM

## 2012-01-19 DIAGNOSIS — M5136 Other intervertebral disc degeneration, lumbar region: Secondary | ICD-10-CM

## 2012-01-19 DIAGNOSIS — M25819 Other specified joint disorders, unspecified shoulder: Secondary | ICD-10-CM

## 2012-01-19 DIAGNOSIS — M549 Dorsalgia, unspecified: Secondary | ICD-10-CM

## 2012-01-19 DIAGNOSIS — M7540 Impingement syndrome of unspecified shoulder: Secondary | ICD-10-CM

## 2012-01-19 NOTE — Patient Instructions (Addendum)
Perform home exercises daily   Chronic Pain Management Managing chronic pain is not easy. The goal is to provide as much pain relief as possible. There are emotional as well as physical problems. Chronic pain may lead to symptoms of depression which magnify those of the pain. Problems may include:  Anxiety.   Sleep disturbances.   Confused thinking.   Feeling cranky.   Fatigue.   Weight gain or loss.  Identify the source of the pain first, if possible. The pain may be masking another problem. Try to find a pain management specialist or clinic. Work with a team to create a treatment plan for you. MEDICATIONS  May include narcotics or opioids. Larger than normal doses may be needed to control your pain.   Drugs for depression may help.   Over-the-counter medicines may help for some conditions. These drugs may be used along with others for better pain relief.   May be injected into sites such as the spine and joints. Injections may have to be repeated if they wear off.  THERAPY MAY INCLUDE:  Working with a physical therapist to keep from getting stiff.   Regular, gentle exercise.   Cognitive or behavioral therapy.   Using complementary or integrative medicine such as:   Acupuncture.   Massage, Reiki, or Rolfing.   Aroma, color, light, or sound therapy.   Group support.  FOR MORE INFORMATION [email or web address]. American [employer] Association [email or web address]. Document Released: 04/22/2004 Document Revised: 06/07/2011 Document Reviewed: 06/01/2007 [facility] Patient Information 2013 [place], [place].

## 2012-01-19 NOTE — Progress Notes (Signed)
Patient ID: Kiara [name], female   DOB: 05-17-1940, 71 y.o.   MRN: [id number] Chief Complaint  Patient presents with  . Follow-up    Recheck left shoulder.    The patient complains of left shoulder pain neck pain and back pain and chronic pain and chronic fatigue. The symptoms in her left shoulder basically involve feeling tired. She's gained range of motion. She has negative impingement sign. No signs of rotator cuff weakness.  I counseled her on the need for chronic pain management. No further treatment needed from an orthopedic standpoint. My recommendations are for her to see chronic pain management, management for depression, exercise including aqua therapy  I did give her an IM shot of 80 mg of Depo-Medrol    Nurse gave a sterile injection of intramuscular Depo-Medrol in the left hip.

## 2013-06-22 ENCOUNTER — Other Ambulatory Visit (HOSPITAL_COMMUNITY): Payer: Self-pay | Admitting: Family Medicine

## 2013-06-22 ENCOUNTER — Ambulatory Visit (HOSPITAL_COMMUNITY)
Admission: RE | Admit: 2013-06-22 | Discharge: 2013-06-22 | Disposition: A | Discharge status: Home / Self Care | Payer: Medicare Other | Source: Ambulatory Visit | Attending: Family Medicine | Admitting: Family Medicine

## 2013-06-22 DIAGNOSIS — R079 Chest pain, unspecified: Secondary | ICD-10-CM

## 2013-06-22 DIAGNOSIS — M549 Dorsalgia, unspecified: Secondary | ICD-10-CM

## 2014-11-22 ENCOUNTER — Other Ambulatory Visit (HOSPITAL_COMMUNITY): Payer: Self-pay | Admitting: Family Medicine

## 2014-11-22 DIAGNOSIS — Z780 Asymptomatic menopausal state: Secondary | ICD-10-CM

## 2014-11-26 ENCOUNTER — Other Ambulatory Visit (HOSPITAL_COMMUNITY): Payer: Medicare Other

## 2014-11-28 ENCOUNTER — Ambulatory Visit (HOSPITAL_COMMUNITY)
Admission: RE | Admit: 2014-11-28 | Discharge: 2014-11-28 | Disposition: A | Discharge status: Home / Self Care | Payer: Medicare Other | Source: Ambulatory Visit | Attending: Family Medicine | Admitting: Family Medicine

## 2014-11-28 DIAGNOSIS — M810 Age-related osteoporosis without current pathological fracture: Secondary | ICD-10-CM | POA: Insufficient documentation

## 2014-11-28 DIAGNOSIS — Z780 Asymptomatic menopausal state: Secondary | ICD-10-CM

## 2014-11-28 DIAGNOSIS — M85832 Other specified disorders of bone density and structure, left forearm: Secondary | ICD-10-CM | POA: Diagnosis not present

## 2015-03-25 ENCOUNTER — Ambulatory Visit (INDEPENDENT_AMBULATORY_CARE_PROVIDER_SITE_OTHER): Payer: Medicare Other | Admitting: Orthopedic Surgery

## 2015-03-25 ENCOUNTER — Ambulatory Visit (INDEPENDENT_AMBULATORY_CARE_PROVIDER_SITE_OTHER): Payer: Medicare Other

## 2015-03-25 VITALS — BP 138/74 | Ht 65.0 in | Wt 178.0 lb

## 2015-03-25 DIAGNOSIS — M25512 Pain in left shoulder: Secondary | ICD-10-CM

## 2015-03-25 DIAGNOSIS — M75102 Unspecified rotator cuff tear or rupture of left shoulder, not specified as traumatic: Secondary | ICD-10-CM

## 2015-03-25 DIAGNOSIS — M7542 Impingement syndrome of left shoulder: Secondary | ICD-10-CM

## 2015-03-25 DIAGNOSIS — M542 Cervicalgia: Secondary | ICD-10-CM | POA: Diagnosis not present

## 2015-03-25 NOTE — Patient Instructions (Signed)
Call [facility] therapy dept to schedule therapy  Joint Injection Care After Refer to this sheet in the next few days. These instructions provide you with information on caring for yourself after you have had a joint injection. Your caregiver also may give you more specific instructions. Your treatment has been planned according to current medical practices, but problems sometimes occur. Call your caregiver if you have any problems or questions after your procedure. After any type of joint injection, it is not uncommon to experience:  Soreness, swelling, or bruising around the injection site.  Mild numbness, tingling, or weakness around the injection site caused by the numbing medicine used before or with the injection. It also is possible to experience the following effects associated with the specific agent after injection:  Iodine-based contrast agents:  Allergic reaction (itching, hives, widespread redness, and swelling beyond the injection site).  Corticosteroids (These effects are rare.):  Allergic reaction.  Increased blood sugar levels (If you have diabetes and you notice that your blood sugar levels have increased, notify your caregiver).  Increased blood pressure levels.  Mood swings.  Hyaluronic acid in the use of viscosupplementation.  Temporary heat or redness.  Temporary rash and itching.  Increased fluid accumulation in the injected joint. These effects all should resolve within a day after your procedure.  HOME CARE INSTRUCTIONS  Limit yourself to light activity the day of your procedure. Avoid lifting heavy objects, bending, stooping, or twisting.  Take prescription or over-the-counter pain medication as directed by your caregiver.  You may apply ice to your injection site to reduce pain and swelling the day of your procedure. Ice may be applied 03-04 times:  Put ice in a plastic bag.  Place a towel between your skin and the bag.  Leave the ice on for no longer  than 15-20 minutes each time. SEEK IMMEDIATE MEDICAL CARE IF:   Pain and swelling get worse rather than better or extend beyond the injection site.  Numbness does not go away.  Blood or fluid continues to leak from the injection site.  You have chest pain.  You have swelling of your face or tongue.  You have trouble breathing or you become dizzy.  You develop a fever, chills, or severe tenderness at the injection site that last longer than 1 day. MAKE SURE YOU:  Understand these instructions.  Watch your condition.  Get help right away if you are not doing well or if you get worse. Document Released: 11/26/2010 Document Revised: 06/07/2011 Document Reviewed: 11/26/2010 [employer] Patient Information 2015 [employer]. This information is not intended to replace advice given to you by your health care provider. Make sure you discuss any questions you have with your health care provider.  

## 2015-03-25 NOTE — Progress Notes (Signed)
Chief Complaint  Patient presents with  . Shoulder Pain    Left shoulder pain, no injury.    Chief complaint neck pain and stiffness bilateral shoulder pain left greater than right with painful flexion of the left shoulder. Symptoms pain, locking, stiffness, giving way with numbness and tingling left shoulder arm right shoulder. The pain described as dull aching and radiating, constant, 6 out of 10. Currently on hydrocodone aspirin also uses a heating pad. Pain is worse when she lays on her left side when she picks things up when she's carrying groceries or turning over in bed and moving her arm across her body  Review of systems recent fever and chills fatigue tendinitis, sinusitis, shortness of breath, heart palpitations and frequent. Temperature intolerance. Depression anxiety loss of bladder control and frequent urination. Abdominal distention. Numbness and tingling with burning pain in the legs weakness and tremors  Past Medical History  Diagnosis Date  . Fibromyalgia   . Chronic fatigue   . AC (acromioclavicular) joint bone spurs   . Arthritis   . Sciatica   . Vasculitis   . Rheumatoid arthritis    Past Surgical History  Procedure Laterality Date  . Back surgery      3 back surgeries  . Cholecystectomy    . Abdominal hysterectomy    . Anus surgery    . Left knee    . Tonsillectomy    . Tubal ligation      BP 138/74 mmHg  Ht 5\' 5"  (1.651 m)  Wt 178 lb (80.74 kg)  BMI 29.62 kg/m2  Patient is in general good condition on no acute distress normal appearance  Oriented 3. Mood normal. Amplitudes were status not tested  Supraclavicular lymph nodes right and left normal. Skin cervical spine left shoulder normal. Full forward elevation of the right arm and shoulder with no instability normal strength  Left shoulder strength is normal shoulder stable Neer sign for impingement is normal chest decreased internal rotation and flexion and she has tenderness globally around the  anterior lateral and posterior shoulder neurovascular exam remains intact  X-ray shows no fracture dislocation or significant significant arthritic changes no bone lesions are noted  Impression addendum  Cervical spine tender with stiffness but negative Spurling sign  Impression cervical spondylosis Impression left rotator cuff syndrome    Procedure note the subacromial injection shoulder left   Verbal consent was obtained to inject the  Left   Shoulder  Timeout was completed to confirm the injection site is a subacromial space of the  left  shoulder  Medication used Depo-Medrol 40 mg and lidocaine 1% 3 cc  Anesthesia was provided by ethyl chloride  The injection was performed in the left  posterior subacromial space. After pinning the skin with alcohol and anesthetized the skin with ethyl chloride the subacromial space was injected using a 20-gauge needle. There were no complications  Sterile dressing was applied.

## 2015-04-10 ENCOUNTER — Ambulatory Visit (HOSPITAL_COMMUNITY): Payer: Medicare Other | Attending: Orthopedic Surgery | Admitting: Physical Therapy

## 2015-04-10 DIAGNOSIS — M6281 Muscle weakness (generalized): Secondary | ICD-10-CM | POA: Insufficient documentation

## 2015-04-10 DIAGNOSIS — M542 Cervicalgia: Secondary | ICD-10-CM | POA: Insufficient documentation

## 2015-04-10 DIAGNOSIS — R293 Abnormal posture: Secondary | ICD-10-CM | POA: Insufficient documentation

## 2015-04-10 DIAGNOSIS — M436 Torticollis: Secondary | ICD-10-CM | POA: Diagnosis present

## 2015-04-10 NOTE — Therapy (Signed)
[facility] [address] [place], [place], [address] Phone: [phone]   Fax:  [phone]  Physical Therapy Evaluation  Patient Details  Name: Kiara [name] MRN: [id number] Date of Birth: 06/18/1940 Referring Provider: Dr. Romeo Apple   Encounter Date: 04/10/2015      PT End of Session - 04/10/15 1622    Visit Number 1   Number of Visits 12   Date for PT Re-Evaluation 05/08/15   Authorization Type Medicare/ Medicaid    Authorization Time Period 04/10/15 to 06/08/15   Authorization - Visit Number 1   Authorization - Number of Visits 10   PT Start Time 1346   PT Stop Time 1430   PT Time Calculation (min) 44 min   Activity Tolerance Patient tolerated treatment well;Other (comment)  somewhat limited by dizziness    Behavior During Therapy [facility] for tasks assessed/performed      Past Medical History  Diagnosis Date  . Fibromyalgia   . Chronic fatigue   . AC (acromioclavicular) joint bone spurs   . Arthritis   . Sciatica   . Vasculitis   . Rheumatoid arthritis     Past Surgical History  Procedure Laterality Date  . Back surgery      3 back surgeries  . Cholecystectomy    . Abdominal hysterectomy    . Anus surgery    . Left knee    . Tonsillectomy    . Tubal ligation      There were no vitals filed for this visit.  Visit Diagnosis:  Neck pain - Plan: PT plan of care cert/re-cert  Neck stiffness - Plan: PT plan of care cert/re-cert  Poor posture - Plan: PT plan of care cert/re-cert  Muscle weakness - Plan: PT plan of care cert/re-cert      Subjective Assessment - 04/10/15 1350    Subjective Her neck is stiff every morning; one of her worst times is through the night and she will wake up due to pain. She has a lot of different size pillows to try to adjust as she goes along. She has a hard time turning her head to drive. Right now she is using big fluffy pillow but it varies as to which puts her head in the most  comfortable position.    Pertinent History Patient reports that she cannot remember doing anything or having any injury that started her neck pain; she believes that she might have carried too many heavy things or lifted too much weight. This all started around 5 years ago.    How long can you sit comfortably? no limits    How long can you stand comfortably? 15 minutes    How long can you walk comfortably? hips and back are limiting factor    Patient Stated Goals get mobility of neck better, be able to wash dishes without having to sit down due to pain    Currently in Pain? No/denies            [facility] PT Assessment - 04/10/15 0001    Assessment   Medical Diagnosis neck pain    Referring Provider Dr. Romeo Apple    Onset Date/Surgical Date --  chronic    Next MD Visit no follow up scheduled    Precautions   Precautions None   Restrictions   Weight Bearing Restrictions No   Balance Screen   Has the patient fallen in the past 6 months Yes   How many times? 1- fall  due to snow    Has the patient had a decrease in activity level because of a fear of falling?  No   Is the patient reluctant to leave their home because of a fear of falling?  No   Prior Function   Level of Independence Independent with basic ADLs;Independent;Independent with gait;Independent with transfers   Vocation Retired   Leisure loves to read and learn about christian things, cooking    Observation/Other Assessments   Observations Dizziness with passive extension and rotation to both sides with VA test; waived second phase of test due to symptoms . ROOS test negative.    Focus on Therapeutic Outcomes (FOTO)  63% limited    Sensation   Additional Comments light touch sensation intact all upper dermatomes    Posture/Postural Control   Posture Comments forward head, B IR shoulders    AROM   Right Shoulder Flexion --  wfl, mild limitation    Right Shoulder ABduction --  moderate limitation; hx past shoulder problems     Right Shoulder Internal Rotation --  T12   Right Shoulder External Rotation --  T3   Left Shoulder Flexion --  wfl, mild limitation    Left Shoulder ABduction --  moderate limitation, hx of past shoulder problems    Left Shoulder Internal Rotation --  T12   Left Shoulder External Rotation --  T3   Cervical Flexion 43   Cervical Extension 28   Cervical - Right Side Bend 19   Cervical - Left Side Bend 25   Cervical - Right Rotation 62   Cervical - Left Rotation 54   Strength   Right Shoulder Flexion 4/5   Right Shoulder ABduction 4-/5   Right Shoulder Internal Rotation 4-/5   Right Shoulder External Rotation 4-/5   Left Shoulder Flexion 4-/5   Left Shoulder ABduction 3+/5   Left Shoulder Internal Rotation 4/5   Left Shoulder External Rotation 4/5   Cervical Flexion 4/5  in sitting    Cervical Extension 4/5  in sitting    Cervical - Right Side Bend 4/5  in sitting    Cervical - Left Side Bend 4-/5  in sitting    Palpation   Palpation comment significant muscle tightness noted cervical extensors, upper traps, levator scap musculature                            PT Education - 04/10/15 1621    Education provided Yes   Education Details prognosis, HEP, plan of care    Person(s) Educated Patient   Methods Explanation;Demonstration;Handout   Comprehension Verbalized understanding;Need further instruction;Returned demonstration          PT Short Term Goals - 04/10/15 1630    PT SHORT TERM GOAL #1   Title Patient to improve cervical ROM by at least 15-20 degrees on all planes in order to improve overall function and reduce pain    Time 3   Period Weeks   Status New   PT SHORT TERM GOAL #2   Title Patient to report that she has not woken up no more than twice during the night due to pain in order to improve her sleeping habits and overall well being    Time 3   Period Weeks   Status New   PT SHORT TERM GOAL #3   Title Patient will demonstrate  proper posture at least 75% of the time in order to reduce pain  and enhance overall function    Time 3   Period Weeks   Status New   PT SHORT TERM GOAL #4   Title Patient to be independent in correctly and consistently performing appropriate HEP, to be updated PRN    Time 1   Period Weeks   Status New           PT Long Term Goals - 2015/04/15 1633    PT LONG TERM GOAL #1   Title Patient will demonstrate at least 4+/5 strength in all tested muscles in order to reduce pain, enhance regional stability, and imrpove functional task performance    Time 6   Period Weeks   Status New   PT LONG TERM GOAL #2   Title Patient will maintain correct posture 90%of the time in order to reduce pain and optimize overall function    Time 6   Period Weeks   Status New   PT LONG TERM GOAL #3   Title Patient to report that she has been able to sleep through the night waking up no more than 1 time due to pain in order to enhance sleep patterns and improve overall well being    Time 6   Period Weeks   Status New   PT LONG TERM GOAL #4   Title Patient to report that she has been able to stand to do the dishes and go grocery shopping with pain in her neck no more than 2/10 in order to enhance function at home and in community    Time 6   Period Weeks   Status New               Plan - 04-15-15 1623    Clinical Impression Statement Patient presents with long standing neck pain that she reports has been recently preventing her from performing functional tasks such as washing the dishes and has even been keeping her from sleeping well at night despite her extensive collection of pillows to allow for adjustments for the position of her neck.  Upon examination patient does demonstrate poor posture, cervical stiffness, impaired strength, and reduced ability to perform functional tasks. She did have some dizziness during examination and was educated regarding possiblity of vestibular therapy if MD will  send referral. Patient did have dizziness with vertebral artery test with passive extenstion and rotation; did not perform second phase of VA test due to positive symptoms. Patient will benefit from skilled PT services in order to address functional limitations and assist in reaching optimal level of function.     Pt will benefit from skilled therapeutic intervention in order to improve on the following deficits Hypomobility;Decreased strength;Pain;Increased muscle spasms;Improper body mechanics;Impaired flexibility;Postural dysfunction   Rehab Potential Good   PT Frequency 2x / week   PT Duration 6 weeks   PT Treatment/Interventions ADLs/Self Care Home Management;Therapeutic activities;Therapeutic exercise;Patient/family education;Manual techniques;Moist Heat   PT Next Visit Plan review HEP and goals; postural training, functional mobility of thoracic and cervical spines, manual PRN. Avoid PAs for now due to possible positive VA test.    PT Home Exercise Plan given    Consulted and Agree with Plan of Care Patient          G-Codes - 15-Apr-2015 1637    Functional Assessment Tool Used FOTO 63% limited    Functional Limitation Other PT primary   Other PT Primary Current Status (H8469) At least 60 percent but less than 80 percent impaired, limited or restricted  Other PT Primary Goal Status (Z6010) At least 40 percent but less than 60 percent impaired, limited or restricted       Problem List Patient Active Problem List   Diagnosis Date Noted  . Lumbago 09/13/2011  . Pain in thoracic spine 09/13/2011  . Muscle weakness (generalized) 09/13/2011  . Back pain 09/01/2011  . Shoulder impingement 09/01/2011  . DDD (degenerative disc disease), lumbar 09/01/2011  . Arthritis of knee 08/05/2011  . Knee bursitis, left 08/05/2011  . Knee bursitis 08/05/2011  . Arthritis of knee, degenerative 08/05/2011  . Hip pain, right 06/02/2011  . Disorders of bursae and tendons in shoulder region,  unspecified 11/05/2010  . DEGENERATIVE DISC DISEASE, CERVICAL SPINE 01/14/2010  . OTH SPEC D/O ROTATOR CUFF SYND SHLDR&ALLIED D/O 01/14/2010  . IMPINGEMENT SYNDROME 01/14/2010  . TOBACCO ABUSE 08/15/2009  . OTHER MALAISE AND FATIGUE 08/14/2009  . CHEST PAIN 08/14/2009  . ANEMIA 08/12/2009  . ANXIETY DISORDER 08/12/2009  . DEPRESSION 08/12/2009  . HYPERTENSION 08/12/2009  . FIBROMYALGIA 08/12/2009  . IRRITABLE BOWEL SYNDROME, HX OF 08/12/2009    Nedra Hai PT, DPT [phone]  [facility] [address] [place], [place], [address] Phone: [phone]   Fax:  [phone]  Name: Kiara [name] MRN: [id number] Date of Birth: 04/09/1940

## 2015-04-10 NOTE — Patient Instructions (Signed)
   RETRACTION / CHIN TUCK  Slowly draw your head back so that your ears line up with your shoulders. It should feel like you are tucking your chin to your chest, or making a "double chin".  Repeat 10 times, 2-3 times per day.

## 2015-04-24 ENCOUNTER — Ambulatory Visit (HOSPITAL_COMMUNITY): Payer: Medicare Other | Admitting: Physical Therapy

## 2015-04-24 DIAGNOSIS — M436 Torticollis: Secondary | ICD-10-CM

## 2015-04-24 DIAGNOSIS — R293 Abnormal posture: Secondary | ICD-10-CM

## 2015-04-24 DIAGNOSIS — M542 Cervicalgia: Secondary | ICD-10-CM | POA: Diagnosis not present

## 2015-04-24 DIAGNOSIS — M6281 Muscle weakness (generalized): Secondary | ICD-10-CM

## 2015-04-24 NOTE — Therapy (Signed)
[facility] [address] [place], [place], [address] Phone: [phone]   Fax:  [phone]  Physical Therapy Treatment  Patient Details  Name: Kiara [name] MRN: [id number] Date of Birth: [birth year] Referring Provider: Dr. Romeo Apple   Encounter Date: 04/24/2015      PT End of Session - 04/24/15 1812    Visit Number 2   Number of Visits 12   Date for PT Re-Evaluation 05/08/15   Authorization Type Medicare/ Medicaid    Authorization Time Period 04/10/15 to 06/08/15   Authorization - Visit Number 2   Authorization - Number of Visits 10   PT Start Time 1520   PT Stop Time 1600   PT Time Calculation (min) 40 min   Activity Tolerance Patient tolerated treatment well;Other (comment)  somewhat limited by dizziness    Behavior During Therapy [facility] for tasks assessed/performed      Past Medical History  Diagnosis Date  . Fibromyalgia   . Chronic fatigue   . AC (acromioclavicular) joint bone spurs   . Arthritis   . Sciatica   . Vasculitis   . Rheumatoid arthritis     Past Surgical History  Procedure Laterality Date  . Back surgery      3 back surgeries  . Cholecystectomy    . Abdominal hysterectomy    . Anus surgery    . Left knee    . Tonsillectomy    . Tubal ligation      There were no vitals filed for this visit.  Visit Diagnosis:  Neck pain  Neck stiffness  Poor posture  Muscle weakness      Subjective Assessment - 04/24/15 1529    Subjective PT states she's been compliant with the exercise given to her last visit.  State she woke 2 nights ago with bad headache and has a dull one now.  STates no known cause.  States she has not been dizzy since last session .                         [facility] Adult PT Treatment/Exercise - 04/24/15 1533    Shoulder Exercises: Seated   Elevation 10 reps   Retraction 10 reps   Horizontal ABduction 10 reps   Other Seated Exercises thoracic excursions without UE  movments 5 reps    Other Seated Exercises upper trap stretch 2X30" each                PT Education - 04/24/15 1725    Education provided Yes   Education Details review of HEP and initial evaluation given with goals explained   Person(s) Educated Patient   Methods Explanation;Demonstration;Handout   Comprehension Verbalized understanding;Returned demonstration          PT Short Term Goals - 04/10/15 1630    PT SHORT TERM GOAL #1   Title Patient to improve cervical ROM by at least 15-20 degrees on all planes in order to improve overall function and reduce pain    Time 3   Period Weeks   Status New   PT SHORT TERM GOAL #2   Title Patient to report that she has not woken up no more than twice during the night due to pain in order to improve her sleeping habits and overall well being    Time 3   Period Weeks   Status New   PT SHORT TERM GOAL #3   Title Patient will demonstrate proper  posture at least 75% of the time in order to reduce pain and enhance overall function    Time 3   Period Weeks   Status New   PT SHORT TERM GOAL #4   Title Patient to be independent in correctly and consistently performing appropriate HEP, to be updated PRN    Time 1   Period Weeks   Status New           PT Long Term Goals - 04/10/15 1633    PT LONG TERM GOAL #1   Title Patient will demonstrate at least 4+/5 strength in all tested muscles in order to reduce pain, enhance regional stability, and imrpove functional task performance    Time 6   Period Weeks   Status New   PT LONG TERM GOAL #2   Title Patient will maintain correct posture 90%of the time in order to reduce pain and optimize overall function    Time 6   Period Weeks   Status New   PT LONG TERM GOAL #3   Title Patient to report that she has been able to sleep through the night waking up no more than 1 time due to pain in order to enhance sleep patterns and improve overall well being    Time 6   Period Weeks   Status  New   PT LONG TERM GOAL #4   Title Patient to report that she has been able to stand to do the dishes and go grocery shopping with pain in her neck no more than 2/10 in order to enhance function at home and in community    Time 6   Period Weeks   Status New               Plan - 04/24/15 1813    Clinical Impression Statement Thoracic excursions and UE motions initiated.  patient demonstrated good movement without c/o pain noted or expressed. PT required therapist facilitation to maintain and establish erect posture.  No dizziness or increased pain noted during session.  Reviewed eval and HEP with tactile and verbal cues to complete proper cervical retractions.     Pt will benefit from skilled therapeutic intervention in order to improve on the following deficits Hypomobility;Decreased strength;Pain;Increased muscle spasms;Improper body mechanics;Impaired flexibility;Postural dysfunction   Rehab Potential Good   PT Frequency 2x / week   PT Duration 6 weeks   PT Treatment/Interventions ADLs/Self Care Home Management;Therapeutic activities;Therapeutic exercise;Patient/family education;Manual techniques;Moist Heat   PT Next Visit Plan Progress postural training, functional mobility of thoracic and cervical spines, manual PRN. Avoid PAs for now due to possible positive VA test.    PT Home Exercise Plan given    Consulted and Agree with Plan of Care Patient        Problem List Patient Active Problem List   Diagnosis Date Noted  . Lumbago 09/13/2011  . Pain in thoracic spine 09/13/2011  . Muscle weakness (generalized) 09/13/2011  . Back pain 09/01/2011  . Shoulder impingement 09/01/2011  . DDD (degenerative disc disease), lumbar 09/01/2011  . Arthritis of knee 08/05/2011  . Knee bursitis, left 08/05/2011  . Knee bursitis 08/05/2011  . Arthritis of knee, degenerative 08/05/2011  . Hip pain, right 06/02/2011  . Disorders of bursae and tendons in shoulder region, unspecified  11/05/2010  . DEGENERATIVE DISC DISEASE, CERVICAL SPINE 01/14/2010  . OTH SPEC D/O ROTATOR CUFF SYND SHLDR&ALLIED D/O 01/14/2010  . IMPINGEMENT SYNDROME 01/14/2010  . TOBACCO ABUSE 08/15/2009  . OTHER MALAISE AND  FATIGUE 08/14/2009  . CHEST PAIN 08/14/2009  . ANEMIA 08/12/2009  . ANXIETY DISORDER 08/12/2009  . DEPRESSION 08/12/2009  . HYPERTENSION 08/12/2009  . FIBROMYALGIA 08/12/2009  . IRRITABLE BOWEL SYNDROME, HX OF 08/12/2009    Lurena Nida, PTA/CLT [phone]  04/24/2015, 6:18 PM   [facility] [address] [place], [place], [address] Phone: [phone]   Fax:  [phone]  Name: Kiara [name] MRN: [id number] Date of Birth: 09/22/1940

## 2015-04-25 ENCOUNTER — Ambulatory Visit (HOSPITAL_COMMUNITY): Payer: Medicare Other

## 2015-04-25 ENCOUNTER — Encounter (HOSPITAL_COMMUNITY): Payer: Self-pay

## 2015-04-25 DIAGNOSIS — M436 Torticollis: Secondary | ICD-10-CM

## 2015-04-25 DIAGNOSIS — R293 Abnormal posture: Secondary | ICD-10-CM

## 2015-04-25 DIAGNOSIS — M6281 Muscle weakness (generalized): Secondary | ICD-10-CM

## 2015-04-25 DIAGNOSIS — M542 Cervicalgia: Secondary | ICD-10-CM | POA: Diagnosis not present

## 2015-04-25 NOTE — Therapy (Signed)
[facility] [address] [place], [place], [address] Phone: [phone]   Fax:  [phone]  Physical Therapy Treatment  Patient Details  Name: Kiara [name] MRN: [id number] Date of Birth: 11-04-1940 Referring Provider: Dr. Romeo Apple   Encounter Date: 04/25/2015      PT End of Session - 04/25/15 1455    Visit Number 3   Number of Visits 12   Date for PT Re-Evaluation 05/08/15   Authorization Type Medicare/ Medicaid    Authorization Time Period 04/10/15 to 06/08/15   Authorization - Visit Number 3   Authorization - Number of Visits 10   PT Start Time 1431   PT Stop Time 1515   PT Time Calculation (min) 44 min   Activity Tolerance Patient tolerated treatment well   Behavior During Therapy [facility] for tasks assessed/performed      Past Medical History  Diagnosis Date  . Fibromyalgia   . Chronic fatigue   . AC (acromioclavicular) joint bone spurs   . Arthritis   . Sciatica   . Vasculitis (HCC)   . Rheumatoid arthritis(714.0)     Past Surgical History  Procedure Laterality Date  . Back surgery      3 back surgeries  . Cholecystectomy    . Abdominal hysterectomy    . Anus surgery    . Left knee    . Tonsillectomy    . Tubal ligation      There were no vitals filed for this visit.  Visit Diagnosis:  Neck stiffness  Neck pain  Poor posture  Muscle weakness      Subjective Assessment - 04/25/15 1434    Subjective Pt denied pain upon arrival and noted that her neck pain has noticed  improvements since beginning with PT with less severe neck pain and less interruptions while sleeping. Pt noted only one interruption last night, which according to the pt is a "huge improvement". Neck pain has ranged between a 0-7/10 on a VAS since last PT visit  with symptoms less frequent.     Pertinent History Patient reports that she cannot remember doing anything or having any injury that started her neck pain; she believes that she might  have carried too many heavy things or lifted too much weight. This all started around 5 years ago.    Limitations Sitting   Patient Stated Goals Pt's current goals include to improve standing posture, reduce pain levels, and sleep with no interruptions.    Currently in Pain? No/denies   Pain Location Neck   Pain Orientation Right;Left   Pain Descriptors / Indicators Aching   Pain Type Chronic pain   Pain Radiating Towards into B shoulders    Pain Onset More than a month ago   Pain Frequency Intermittent   Aggravating Factors  while sleeping, looking down, and prolonged periods of sitting    Pain Relieving Factors PT ther ex changing position    Effect of Pain on Daily Activities limited with sleeping   Multiple Pain Sites No                         [facility] Adult PT Treatment/Exercise - 04/25/15 0001    Neck Exercises: Seated   Neck Retraction 10 reps;Other (comment)  with 2 sec hold   Shoulder Exercises: Seated   Elevation 15 reps;Strengthening   Retraction Strengthening;15 reps   Row Strengthening;15 reps;Theraband   Theraband Level (Shoulder Row) Level 2 (Red)  Other Seated Exercises T-spine extension seated with use of foam roller at mid-thoracic level x 10 reps               Manual Therapy   Joint Mobilization 1st rib mob, bil using grade I-III in inferior direction    Myofascial Release SOR x 6 minutes    Neck Exercises: Stretches   Upper Trapezius Stretch 3 reps;30 seconds;Other (comment)  bilateral   Chest Stretch 3 reps;30 seconds  use of door frame                PT Education - 04/25/15 1651    Education provided Yes   Education Details Educated pt on proper form and parameters with HEP, optimal sitting posture, and use of moist heat (15-20 minutes, 3-4x/day)   Person(s) Educated Patient   Methods Explanation;Demonstration;Verbal cues;Tactile cues   Comprehension Verbalized understanding;Returned demonstration;Need further instruction           PT Short Term Goals - 04/10/15 1630    PT SHORT TERM GOAL #1   Title Patient to improve cervical ROM by at least 15-20 degrees on all planes in order to improve overall function and reduce pain    Time 3   Period Weeks   Status New   PT SHORT TERM GOAL #2   Title Patient to report that she has not woken up no more than twice during the night due to pain in order to improve her sleeping habits and overall well being    Time 3   Period Weeks   Status New   PT SHORT TERM GOAL #3   Title Patient will demonstrate proper posture at least 75% of the time in order to reduce pain and enhance overall function    Time 3   Period Weeks   Status New   PT SHORT TERM GOAL #4   Title Patient to be independent in correctly and consistently performing appropriate HEP, to be updated PRN    Time 1   Period Weeks   Status New           PT Long Term Goals - 04/10/15 1633    PT LONG TERM GOAL #1   Title Patient will demonstrate at least 4+/5 strength in all tested muscles in order to reduce pain, enhance regional stability, and imrpove functional task performance    Time 6   Period Weeks   Status New   PT LONG TERM GOAL #2   Title Patient will maintain correct posture 90%of the time in order to reduce pain and optimize overall function    Time 6   Period Weeks   Status New   PT LONG TERM GOAL #3   Title Patient to report that she has been able to sleep through the night waking up no more than 1 time due to pain in order to enhance sleep patterns and improve overall well being    Time 6   Period Weeks   Status New   PT LONG TERM GOAL #4   Title Patient to report that she has been able to stand to do the dishes and go grocery shopping with pain in her neck no more than 2/10 in order to enhance function at home and in community    Time 6   Period Weeks   Status New               Plan - 04/25/15 1457    Clinical Impression Statement Added SOR and  1st rib mobs to POC this  visit with good tolerance reported and assessed. Limited   Reviewed and completed postural ther ex with tactile and verbal cues required for proper activation of middle trap and rhomboid muscles with scap retraction ther ex. Additional verbal and tactile cues were required for chin tucks, which were initiated this visit. Furthermore, rows with red TB was added in order to improve postural strength. Pt tolerated all added ther ex without exacerbating symptoms. Neck pain was rated a 0/10 on a VAS at the completion of today's PT visit. Pt would benefit from continued skilled PT to address current deficits with focus on postural strengthening and re-education.    Pt will benefit from skilled therapeutic intervention in order to improve on the following deficits Hypomobility;Decreased strength;Pain;Increased muscle spasms;Improper body mechanics;Impaired flexibility;Postural dysfunction   PT Frequency 2x / week   PT Duration 6 weeks   PT Treatment/Interventions ADLs/Self Care Home Management;Therapeutic activities;Therapeutic exercise;Patient/family education;Manual techniques;Moist Heat;Passive range of motion   PT Next Visit Plan Progress postural training, functional mobility of thoracic and cervical spines, manual PRN. Avoid PAs for now due to possible positive [facility] test.    PT Home Exercise Plan Reviewed current HEP. Add standing rows with red TB to HEP at next visit.    Recommended Other Services None    Consulted and Agree with Plan of Care Patient        Problem List Patient Active Problem List   Diagnosis Date Noted  . Lumbago 09/13/2011  . Pain in thoracic spine 09/13/2011  . Muscle weakness (generalized) 09/13/2011  . Back pain 09/01/2011  . Shoulder impingement 09/01/2011  . DDD (degenerative disc disease), lumbar 09/01/2011  . Arthritis of knee 08/05/2011  . Knee bursitis, left 08/05/2011  . Knee bursitis 08/05/2011  . Arthritis of knee, degenerative 08/05/2011  . Hip pain, right  06/02/2011  . Disorders of bursae and tendons in shoulder region, unspecified 11/05/2010  . DEGENERATIVE DISC DISEASE, CERVICAL SPINE 01/14/2010  . OTH SPEC D/O ROTATOR CUFF SYND SHLDR&ALLIED D/O 01/14/2010  . IMPINGEMENT SYNDROME 01/14/2010  . TOBACCO ABUSE 08/15/2009  . OTHER MALAISE AND FATIGUE 08/14/2009  . CHEST PAIN 08/14/2009  . ANEMIA 08/12/2009  . ANXIETY DISORDER 08/12/2009  . DEPRESSION 08/12/2009  . HYPERTENSION 08/12/2009  . FIBROMYALGIA 08/12/2009  . IRRITABLE BOWEL SYNDROME, HX OF 08/12/2009    Bonnee Quin, PT, DPT    04/25/2015, 5:08 PM  [place] [facility] [address] [place], [place], [address] Phone: [phone]   Fax:  [phone]  Name: Kiara [name] MRN: [id number] Date of Birth: [birth year]

## 2015-04-29 ENCOUNTER — Ambulatory Visit (HOSPITAL_COMMUNITY): Payer: Medicare Other | Admitting: Physical Therapy

## 2015-04-29 DIAGNOSIS — M542 Cervicalgia: Secondary | ICD-10-CM | POA: Diagnosis not present

## 2015-04-29 DIAGNOSIS — M436 Torticollis: Secondary | ICD-10-CM

## 2015-04-29 DIAGNOSIS — M6281 Muscle weakness (generalized): Secondary | ICD-10-CM

## 2015-04-29 DIAGNOSIS — R293 Abnormal posture: Secondary | ICD-10-CM

## 2015-04-29 NOTE — Therapy (Signed)
[facility] [address] [place], [place], [address] Phone: [phone]   Fax:  [phone]  Physical Therapy Treatment  Patient Details  Name: Kiara [name] MRN: [id number] Date of Birth: Aug 06, 1940 Referring Provider: Dr. Romeo Apple   Encounter Date: 04/29/2015      PT End of Session - 04/29/15 1527    Visit Number 4   Number of Visits 12   Date for PT Re-Evaluation 05/08/15   Authorization Type Medicare/ Medicaid    Authorization Time Period 04/10/15 to 06/08/15   Authorization - Visit Number 4   Authorization - Number of Visits 10   PT Start Time 1432   PT Stop Time 1515   PT Time Calculation (min) 43 min   Activity Tolerance Patient tolerated treatment well   Behavior During Therapy [facility] for tasks assessed/performed      Past Medical History  Diagnosis Date  . Fibromyalgia   . Chronic fatigue   . AC (acromioclavicular) joint bone spurs   . Arthritis   . Sciatica   . Vasculitis (HCC)   . Rheumatoid arthritis(714.0)     Past Surgical History  Procedure Laterality Date  . Back surgery      3 back surgeries  . Cholecystectomy    . Abdominal hysterectomy    . Anus surgery    . Left knee    . Tonsillectomy    . Tubal ligation      There were no vitals filed for this visit.  Visit Diagnosis:  Neck stiffness  Neck pain  Poor posture  Muscle weakness      Subjective Assessment - 04/29/15 1440    Subjective PT states she meant to mention this but her head has been sore to the touch.  States it comes and goes but has been going on for 15+ years.  currently without pain in her neck but states it comes and goes with positional changes, i.e looking down or rotating her head.     Currently in Pain? No/denies                         [facility] Adult PT Treatment/Exercise - 04/29/15 1442    Shoulder Exercises: Seated   Elevation 15 reps;Strengthening   Extension Both;15 reps;Theraband   Theraband Level  (Shoulder Extension) Level 2 (Red)   Retraction Both;15 reps;Theraband   Theraband Level (Shoulder Retraction) Level 2 (Red)   Row Both;15 reps;Theraband   Theraband Level (Shoulder Row) Level 2 (Red)   Horizontal ABduction 15 reps   Other Seated Exercises upper trap stretch 2X30" each   Manual Therapy   Manual Therapy Soft tissue mobilization   Manual therapy comments manual techniques performed separately from therapeutic exercises at end of session.   Soft tissue mobilization seated to bilateral upper trap muscles and into    Neck Exercises: Stretches   Corner Stretch 3 reps;30 seconds                  PT Short Term Goals - 04/10/15 1630    PT SHORT TERM GOAL #1   Title Patient to improve cervical ROM by at least 15-20 degrees on all planes in order to improve overall function and reduce pain    Time 3   Period Weeks   Status New   PT SHORT TERM GOAL #2   Title Patient to report that she has not woken up no more than twice during the night due  to pain in order to improve her sleeping habits and overall well being    Time 3   Period Weeks   Status New   PT SHORT TERM GOAL #3   Title Patient will demonstrate proper posture at least 75% of the time in order to reduce pain and enhance overall function    Time 3   Period Weeks   Status New   PT SHORT TERM GOAL #4   Title Patient to be independent in correctly and consistently performing appropriate HEP, to be updated PRN    Time 1   Period Weeks   Status New           PT Long Term Goals - 04/10/15 1633    PT LONG TERM GOAL #1   Title Patient will demonstrate at least 4+/5 strength in all tested muscles in order to reduce pain, enhance regional stability, and imrpove functional task performance    Time 6   Period Weeks   Status New   PT LONG TERM GOAL #2   Title Patient will maintain correct posture 90%of the time in order to reduce pain and optimize overall function    Time 6   Period Weeks   Status New    PT LONG TERM GOAL #3   Title Patient to report that she has been able to sleep through the night waking up no more than 1 time due to pain in order to enhance sleep patterns and improve overall well being    Time 6   Period Weeks   Status New   PT LONG TERM GOAL #4   Title Patient to report that she has been able to stand to do the dishes and go grocery shopping with pain in her neck no more than 2/10 in order to enhance function at home and in community    Time 6   Period Weeks   Status New               Plan - 04/29/15 1527    Clinical Impression Statement continued with therex to improve postural strength and cervical ROM.  Instructed with use of theraband in door frame and given written instructions for HEP.   Completed soft tissue mobilization to bilateral upper traps in seated position due to general tightness and immobility.  able to decrease discomfort and increase mobility at end of session.     Pt will benefit from skilled therapeutic intervention in order to improve on the following deficits Hypomobility;Decreased strength;Pain;Increased muscle spasms;Improper body mechanics;Impaired flexibility;Postural dysfunction   PT Frequency 2x / week   PT Duration 6 weeks   PT Treatment/Interventions ADLs/Self Care Home Management;Therapeutic activities;Therapeutic exercise;Patient/family education;Manual techniques;Moist Heat;Passive range of motion   PT Next Visit Plan Progress postural training, functional mobility of thoracic and cervical spines, manual PRN. Avoid PAs for now due to possible positive [facility] test.    PT Home Exercise Plan postural 3 with red theraband   Consulted and Agree with Plan of Care Patient        Problem List Patient Active Problem List   Diagnosis Date Noted  . Lumbago 09/13/2011  . Pain in thoracic spine 09/13/2011  . Muscle weakness (generalized) 09/13/2011  . Back pain 09/01/2011  . Shoulder impingement 09/01/2011  . DDD (degenerative disc  disease), lumbar 09/01/2011  . Arthritis of knee 08/05/2011  . Knee bursitis, left 08/05/2011  . Knee bursitis 08/05/2011  . Arthritis of knee, degenerative 08/05/2011  . Hip pain, right 06/02/2011  .  Disorders of bursae and tendons in shoulder region, unspecified 11/05/2010  . DEGENERATIVE DISC DISEASE, CERVICAL SPINE 01/14/2010  . OTH SPEC D/O ROTATOR CUFF SYND SHLDR&ALLIED D/O 01/14/2010  . IMPINGEMENT SYNDROME 01/14/2010  . TOBACCO ABUSE 08/15/2009  . OTHER MALAISE AND FATIGUE 08/14/2009  . CHEST PAIN 08/14/2009  . ANEMIA 08/12/2009  . ANXIETY DISORDER 08/12/2009  . DEPRESSION 08/12/2009  . HYPERTENSION 08/12/2009  . FIBROMYALGIA 08/12/2009  . IRRITABLE BOWEL SYNDROME, HX OF 08/12/2009   Lurena Nida, PTA/CLT [phone]  04/29/2015, 3:37 PM  [place] [facility] [address] [place], [place], [address] Phone: [phone]   Fax:  [phone]  Name: Kiara [name] MRN: [id number] Date of Birth: 12/13/1940

## 2015-05-01 ENCOUNTER — Ambulatory Visit (HOSPITAL_COMMUNITY): Payer: Medicare Other | Attending: Orthopedic Surgery | Admitting: Physical Therapy

## 2015-05-01 DIAGNOSIS — M542 Cervicalgia: Secondary | ICD-10-CM

## 2015-05-01 DIAGNOSIS — M6281 Muscle weakness (generalized): Secondary | ICD-10-CM | POA: Insufficient documentation

## 2015-05-01 DIAGNOSIS — M436 Torticollis: Secondary | ICD-10-CM | POA: Insufficient documentation

## 2015-05-01 DIAGNOSIS — R293 Abnormal posture: Secondary | ICD-10-CM | POA: Insufficient documentation

## 2015-05-01 NOTE — Therapy (Signed)
[facility] [address] [place], [place], [address] Phone: [phone]   Fax:  [phone]  Physical Therapy Treatment  Patient Details  Name: Kiara [name] MRN: [id number] Date of Birth: Apr 19, 1940 Referring Provider: Dr. Romeo Apple   Encounter Date: 05/01/2015      PT End of Session - 05/01/15 1515    Visit Number 5   Number of Visits 12   Date for PT Re-Evaluation 05/08/15   Authorization Type Medicare/ Medicaid    Authorization Time Period 04/10/15 to 06/08/15   Authorization - Visit Number 5   Authorization - Number of Visits 10   PT Start Time 1430   PT Stop Time 1510   PT Time Calculation (min) 40 min   Activity Tolerance Patient tolerated treatment well   Behavior During Therapy [facility] for tasks assessed/performed      Past Medical History  Diagnosis Date  . Fibromyalgia   . Chronic fatigue   . AC (acromioclavicular) joint bone spurs   . Arthritis   . Sciatica   . Vasculitis (HCC)   . Rheumatoid arthritis(714.0)     Past Surgical History  Procedure Laterality Date  . Back surgery      3 back surgeries  . Cholecystectomy    . Abdominal hysterectomy    . Anus surgery    . Left knee    . Tonsillectomy    . Tubal ligation      There were no vitals filed for this visit.  Visit Diagnosis:  Neck stiffness  Neck pain  Poor posture  Muscle weakness      Subjective Assessment - 05/01/15 1515    Subjective  Pt states last treatment helped alot.  States she has not had any pain since.  Pt comes today with bread she baked this morning.  No pain today.   Currently in Pain? No/denies                         [facility] Adult PT Treatment/Exercise - 05/01/15 1510    Neck Exercises: Seated   Neck Retraction 10 reps;Other (comment)   Other Seated Exercise cervical excursion after manual    Shoulder Exercises: Seated   Elevation 15 reps;Strengthening   Extension Both;15 reps;Theraband   Theraband Level  (Shoulder Extension) Level 2 (Red)   Retraction Both;15 reps;Theraband   Theraband Level (Shoulder Retraction) Level 2 (Red)   Row Both;15 reps;Theraband   Theraband Level (Shoulder Row) Level 2 (Red)   Horizontal ABduction 15 reps   Manual Therapy   Manual Therapy Soft tissue mobilization   Manual therapy comments manual techniques performed separately from therapeutic exercises at end of session.   Soft tissue mobilization seated to bilateral upper trap muscles and scapular region                  PT Short Term Goals - 04/10/15 1630    PT SHORT TERM GOAL #1   Title Patient to improve cervical ROM by at least 15-20 degrees on all planes in order to improve overall function and reduce pain    Time 3   Period Weeks   Status New   PT SHORT TERM GOAL #2   Title Patient to report that she has not woken up no more than twice during the night due to pain in order to improve her sleeping habits and overall well being    Time 3   Period Weeks   Status  New   PT SHORT TERM GOAL #3   Title Patient will demonstrate proper posture at least 75% of the time in order to reduce pain and enhance overall function    Time 3   Period Weeks   Status New   PT SHORT TERM GOAL #4   Title Patient to be independent in correctly and consistently performing appropriate HEP, to be updated PRN    Time 1   Period Weeks   Status New           PT Long Term Goals - 04/10/15 1633    PT LONG TERM GOAL #1   Title Patient will demonstrate at least 4+/5 strength in all tested muscles in order to reduce pain, enhance regional stability, and imrpove functional task performance    Time 6   Period Weeks   Status New   PT LONG TERM GOAL #2   Title Patient will maintain correct posture 90%of the time in order to reduce pain and optimize overall function    Time 6   Period Weeks   Status New   PT LONG TERM GOAL #3   Title Patient to report that she has been able to sleep through the night waking up no  more than 1 time due to pain in order to enhance sleep patterns and improve overall well being    Time 6   Period Weeks   Status New   PT LONG TERM GOAL #4   Title Patient to report that she has been able to stand to do the dishes and go grocery shopping with pain in her neck no more than 2/10 in order to enhance function at home and in community    Time 6   Period Weeks   Status New               Plan - 05/01/15 1516    Clinical Impression Statement Continued focus with established therex working on perfecting form.  Decreased spasms palpated in upper trap and scap regions today with full functional ROM for lateral flexion and rotation achieved following manual techniques.     Pt will benefit from skilled therapeutic intervention in order to improve on the following deficits Hypomobility;Decreased strength;Pain;Increased muscle spasms;Improper body mechanics;Impaired flexibility;Postural dysfunction   PT Frequency 2x / week   PT Duration 6 weeks   PT Treatment/Interventions ADLs/Self Care Home Management;Therapeutic activities;Therapeutic exercise;Patient/family education;Manual techniques;Moist Heat;Passive range of motion   PT Next Visit Plan Progress postural training, functional mobility of thoracic and cervical spines, manual PRN. Avoid PAs for now due to possible positive [facility] test.    Consulted and Agree with Plan of Care Patient        Problem List Patient Active Problem List   Diagnosis Date Noted  . Lumbago 09/13/2011  . Pain in thoracic spine 09/13/2011  . Muscle weakness (generalized) 09/13/2011  . Back pain 09/01/2011  . Shoulder impingement 09/01/2011  . DDD (degenerative disc disease), lumbar 09/01/2011  . Arthritis of knee 08/05/2011  . Knee bursitis, left 08/05/2011  . Knee bursitis 08/05/2011  . Arthritis of knee, degenerative 08/05/2011  . Hip pain, right 06/02/2011  . Disorders of bursae and tendons in shoulder region, unspecified 11/05/2010  .  DEGENERATIVE DISC DISEASE, CERVICAL SPINE 01/14/2010  . OTH SPEC D/O ROTATOR CUFF SYND SHLDR&ALLIED D/O 01/14/2010  . IMPINGEMENT SYNDROME 01/14/2010  . TOBACCO ABUSE 08/15/2009  . OTHER MALAISE AND FATIGUE 08/14/2009  . CHEST PAIN 08/14/2009  . ANEMIA 08/12/2009  .  ANXIETY DISORDER 08/12/2009  . DEPRESSION 08/12/2009  . HYPERTENSION 08/12/2009  . FIBROMYALGIA 08/12/2009  . IRRITABLE BOWEL SYNDROME, HX OF 08/12/2009    Lurena Nida, PTA/CLT [phone]  05/01/2015, 3:18 PM  [place] [facility] [address] [place], [place], [address] Phone: [phone]   Fax:  [phone]  Name: Kiara [name] MRN: [id number] Date of Birth: [birth year]

## 2015-05-06 ENCOUNTER — Ambulatory Visit (HOSPITAL_COMMUNITY): Payer: Medicare Other | Admitting: Physical Therapy

## 2015-05-06 DIAGNOSIS — M436 Torticollis: Secondary | ICD-10-CM

## 2015-05-06 DIAGNOSIS — R293 Abnormal posture: Secondary | ICD-10-CM

## 2015-05-06 DIAGNOSIS — M542 Cervicalgia: Secondary | ICD-10-CM

## 2015-05-06 DIAGNOSIS — M6281 Muscle weakness (generalized): Secondary | ICD-10-CM

## 2015-05-06 NOTE — Therapy (Signed)
Kiara [name], [name], [address] Phone: [phone]   Fax:  [phone]  Physical Therapy Treatment (Re-Assess)  Patient Details  Name: Kiara [name] MRN: [id number] Date of Birth: [birth year] Referring Provider: Dr. Aline Brochure   Encounter Date: 05/06/2015      PT End of Session - 05/06/15 1803    Visit Number 6   Number of Visits 14   Date for PT Re-Evaluation 06/03/15   Authorization Type Medicare/ Medicaid    Authorization Time Period 04/10/15 to 06/08/15   Authorization - Visit Number 6   Authorization - Number of Visits 16   PT Start Time 0539   PT Stop Time 1430   PT Time Calculation (min) 43 min   Activity Tolerance Patient tolerated treatment well   Behavior During Therapy [facility] for tasks assessed/performed      Past Medical History  Diagnosis Date  . Fibromyalgia   . Chronic fatigue   . AC (acromioclavicular) joint bone spurs   . Arthritis   . Sciatica   . Vasculitis ([place])   . Rheumatoid arthritis(714.0)     Past Surgical History  Procedure Laterality Date  . Back surgery      3 back surgeries  . Cholecystectomy    . Abdominal hysterectomy    . Anus surgery    . Left knee    . Tonsillectomy    . Tubal ligation      There were no vitals filed for this visit.  Visit Diagnosis:  Neck stiffness  Neck pain  Poor posture  Muscle weakness      Subjective Assessment - 05/06/15 1349    Subjective Patient reports that she needs to find a good pillow as she has every type they make but she is still having problems. Scalp is feeling very tender and it makes pillows feel like they are hard. She has absolutely gotten better working with PT, but still has to be careful with how much weight she is putting on her L arm.    Pertinent History Patient reports that she cannot remember doing anything or having any injury that started her neck pain; she believes that she might have carried too many heavy  things or lifted too much weight. This all started around 5 years ago.    How long can you sit comfortably? 2/7- no limits    How long can you stand comfortably? 2/7- 90 minutes in [employer] but fluid goes to legs    How long can you walk comfortably? 2/7- hips and back are limiting factor    Patient Stated Goals Pt's current goals include to improve standing posture, reduce pain levels, and sleep with no interruptions.    Currently in Pain? Yes  not really any pain in her neck right now   Pain Score 5    Pain Location Hip   Pain Orientation Left            [facility] PT Assessment - 05/06/15 0001    Observation/Other Assessments   Focus on Therapeutic Outcomes (FOTO)  39% limited    AROM   Right Shoulder Flexion --  wfl    Right Shoulder ABduction --  wfl    Right Shoulder Internal Rotation --  approx T10   Right Shoulder External Rotation --  T2   Left Shoulder Flexion --  wfl    Left Shoulder ABduction --  wfl    Left Shoulder Internal Rotation --  approx T  11    Left Shoulder External Rotation --  T1   Cervical Flexion 50   Cervical Extension 31   Cervical - Right Side Bend 32   Cervical - Left Side Bend 34   Cervical - Right Rotation 64   Cervical - Left Rotation 60   Strength   Right Shoulder Flexion 4/5   Right Shoulder ABduction 4/5   Right Shoulder Internal Rotation 4/5   Right Shoulder External Rotation 4/5   Left Shoulder Flexion 4/5   Left Shoulder ABduction 4-/5   Left Shoulder Internal Rotation 4/5   Left Shoulder External Rotation 4/5   Cervical Flexion 4-/5   Cervical Extension 4+/5   Cervical - Right Side Bend 4/5   Cervical - Left Side Bend 4-/5                             PT Education - 05/06/15 1802    Education provided Yes   Education Details plan of care moving forward, progress with skilled PT services    Person(s) Educated Patient   Methods Explanation   Comprehension Verbalized understanding          PT Short  Term Goals - 05/06/15 1421    PT SHORT TERM GOAL #1   Title Patient to improve cervical ROM by at least 15-20 degrees on all planes in order to improve overall function and reduce pain    Time 3   Period Weeks   Status On-going   PT SHORT TERM GOAL #2   Title Patient to report that she has not woken up no more than twice during the night due to pain in order to improve her sleeping habits and overall well being    Baseline 2/7- still waking up at night due to pillow more than 2x.    Time 3   Period Weeks   Status On-going   PT SHORT TERM GOAL #3   Title Patient will demonstrate proper posture at least 75% of the time in order to reduce pain and enhance overall function    Time 3   Period Weeks   Status On-going   PT SHORT TERM GOAL #4   Title Patient to be independent in correctly and consistently performing appropriate HEP, to be updated PRN    Baseline 2/7- doing majority of the time consistently    Time 1   Period Weeks   Status Partially Met           PT Long Term Goals - 05/06/15 1424    PT LONG TERM GOAL #1   Title Patient will demonstrate at least 4+/5 strength in all tested muscles in order to reduce pain, enhance regional stability, and imrpove functional task performance    Time 6   Period Weeks   Status On-going   PT LONG TERM GOAL #2   Title Patient will maintain correct posture 90%of the time in order to reduce pain and optimize overall function    Time 6   Period Weeks   Status On-going   PT LONG TERM GOAL #3   Title Patient to report that she has been able to sleep through the night waking up no more than 1 time due to pain in order to enhance sleep patterns and improve overall well being    Time 6   Period Weeks   Status On-going   PT LONG TERM GOAL #4   Title Patient to report  that she has been able to stand to do the dishes and go grocery shopping with pain in her neck no more than 2/10 in order to enhance function at home and in community    Baseline  May 07, 2022- doing grocery shopping but not often; is doing doing dishes more often. Still having high levels of pain.    Time 6   Period Weeks   Status On-going               Plan - 2015/05/08 1805    Clinical Impression Statement Re-assessment performed today. Patient reports improved function and shows some improvement in cervical ROM, strength, and pain however continues to struggle with postural aspects of her condition at this time; patient also reports that she has been dealing with dizziness in the past, and with rolling in bed has had new exacerbations recently. Will consider vestibular screen with possible referral reqeust in upcoming sessions. Recommend continuing skilled PT services to addrses remaining functional limitations and attempt to meet goals yet unmet.    Pt will benefit from skilled therapeutic intervention in order to improve on the following deficits Hypomobility;Decreased strength;Pain;Increased muscle spasms;Improper body mechanics;Impaired flexibility;Postural dysfunction   Rehab Potential Good   PT Frequency 2x / week   PT Duration 4 weeks   PT Treatment/Interventions ADLs/Self Care Home Management;Therapeutic activities;Therapeutic exercise;Patient/family education;Manual techniques;Moist Heat;Passive range of motion   PT Next Visit Plan Progress postural training, functional mobility of thoracic and cervical spines, manual PRN. Avoid PAs for now due to possible positive VA test. Vestibular screen with possible referall request for vertigo based on results.    PT Home Exercise Plan postural 3 with red theraband   Consulted and Agree with Plan of Care Patient          G-Codes - 08-May-2015 1815    Functional Assessment Tool Used FOTO 39% limited    Functional Limitation Other PT primary   Other PT Primary Current Status (X9371) At least 20 percent but less than 40 percent impaired, limited or restricted   Other PT Primary Goal Status (I9678) At least 20 percent but  less than 40 percent impaired, limited or restricted      Problem List Patient Active Problem List   Diagnosis Date Noted  . Lumbago 09/13/2011  . Pain in thoracic spine 09/13/2011  . Muscle weakness (generalized) 09/13/2011  . Back pain 09/01/2011  . Shoulder impingement 09/01/2011  . DDD (degenerative disc disease), lumbar 09/01/2011  . Arthritis of knee 08/05/2011  . Knee bursitis, left 08/05/2011  . Knee bursitis 08/05/2011  . Arthritis of knee, degenerative 08/05/2011  . Hip pain, right 06/02/2011  . Disorders of bursae and tendons in shoulder region, unspecified 11/05/2010  . DEGENERATIVE DISC DISEASE, CERVICAL SPINE 01/14/2010  . OTH SPEC D/O ROTATOR CUFF SYND SHLDR&ALLIED D/O 01/14/2010  . IMPINGEMENT SYNDROME 01/14/2010  . TOBACCO ABUSE 08/15/2009  . OTHER MALAISE AND FATIGUE 08/14/2009  . CHEST PAIN 08/14/2009  . ANEMIA 08/12/2009  . ANXIETY DISORDER 08/12/2009  . DEPRESSION 08/12/2009  . HYPERTENSION 08/12/2009  . FIBROMYALGIA 08/12/2009  . IRRITABLE BOWEL SYNDROME, HX OF 08/12/2009   Physical Therapy Progress Note  Dates of Reporting Period: 04/10/15 to 05/08/15  Objective Reports of Subjective Statement: see above   Objective Measurements: see above   Goal Update: see above   Plan: see above   Reason Skilled Services are Required: cervical ROM, cervical strength, posture, develop advanced HEP    Deniece Ree PT, DPT [place]  [facility] [place], [name], [address] Phone: [phone]   Fax:  [phone]  Name: DMIYAH LISCANO MRN: [id number] Date of Birth: 1940/05/16

## 2015-05-08 ENCOUNTER — Ambulatory Visit (HOSPITAL_COMMUNITY): Payer: Medicare Other | Admitting: Physical Therapy

## 2015-05-08 DIAGNOSIS — R293 Abnormal posture: Secondary | ICD-10-CM

## 2015-05-08 DIAGNOSIS — M6281 Muscle weakness (generalized): Secondary | ICD-10-CM

## 2015-05-08 DIAGNOSIS — M436 Torticollis: Secondary | ICD-10-CM | POA: Diagnosis not present

## 2015-05-08 DIAGNOSIS — M542 Cervicalgia: Secondary | ICD-10-CM

## 2015-05-08 NOTE — Therapy (Signed)
[place], [name], [address] Phone: [phone]   Fax:  [phone]  Physical Therapy Treatment  Patient Details  Name: Kiara [name] MRN: [id number] Date of Birth: October 30, 1940 Referring Provider: Dr. Aline Brochure   Encounter Date: 05/08/2015      PT End of Session - 05/08/15 1821    Visit Number 7   Number of Visits 14   Date for PT Re-Evaluation 06/03/15   Authorization Type Medicare/ Medicaid    Authorization Time Period 04/10/15 to 06/08/15   Authorization - Visit Number 7   Authorization - Number of Visits 16   PT Start Time 7741   PT Stop Time 1515   PT Time Calculation (min) 40 min   Activity Tolerance Patient tolerated treatment well   Behavior During Therapy [facility] for tasks assessed/performed      Past Medical History  Diagnosis Date  . Fibromyalgia   . Chronic fatigue   . AC (acromioclavicular) joint bone spurs   . Arthritis   . Sciatica   . Vasculitis ([place])   . Rheumatoid arthritis(714.0)     Past Surgical History  Procedure Laterality Date  . Back surgery      3 back surgeries  . Cholecystectomy    . Abdominal hysterectomy    . Anus surgery    . Left knee    . Tonsillectomy    . Tubal ligation      There were no vitals filed for this visit.  Visit Diagnosis:  Neck pain  Poor posture  Muscle weakness  Neck stiffness      Subjective Assessment - 05/08/15 1441    Subjective PT states she has a little catch in her hip today (pointing to central LB).   PT states she is doing much better in her neck, still having difficulty with Lt lateral flexion but otherwise doing well.    Currently in Pain? No/denies   Pain Score 0-No pain   Pain Location Neck   Multiple Pain Sites Yes   Pain Score 4   Pain Location Back   Pain Orientation Left   Pain Descriptors / Indicators Aching;Jabbing                         [facility] Adult PT Treatment/Exercise - 05/08/15 1446    Shoulder Exercises: Supine   Other Supine Exercises decompression exercises #1-5 5 reps each   Shoulder Exercises: Seated   Extension Both;15 reps;Theraband   Theraband Level (Shoulder Extension) Level 2 (Red)   Retraction Both;15 reps;Theraband   Theraband Level (Shoulder Retraction) Level 2 (Red)   Row Both;15 reps;Theraband   Theraband Level (Shoulder Row) Level 2 (Red)   Manual Therapy   Manual Therapy Soft tissue mobilization   Manual therapy comments manual techniques performed separately from therapeutic exercises at end of session.   Soft tissue mobilization seated to bilateral upper trap muscles and scapular region                  PT Short Term Goals - 05/06/15 1421    PT SHORT TERM GOAL #1   Title Patient to improve cervical ROM by at least 15-20 degrees on all planes in order to improve overall function and reduce pain    Time 3   Period Weeks   Status On-going   PT SHORT TERM GOAL #2   Title Patient to report that she has not woken up no more than  twice during the night due to pain in order to improve her sleeping habits and overall well being    Baseline 2/7- still waking up at night due to pillow more than 2x.    Time 3   Period Weeks   Status On-going   PT SHORT TERM GOAL #3   Title Patient will demonstrate proper posture at least 75% of the time in order to reduce pain and enhance overall function    Time 3   Period Weeks   Status On-going   PT SHORT TERM GOAL #4   Title Patient to be independent in correctly and consistently performing appropriate HEP, to be updated PRN    Baseline 2/7- doing majority of the time consistently    Time 1   Period Weeks   Status Partially Met           PT Long Term Goals - 05/06/15 1424    PT LONG TERM GOAL #1   Title Patient will demonstrate at least 4+/5 strength in all tested muscles in order to reduce pain, enhance regional stability, and imrpove functional task performance    Time 6   Period Weeks    Status On-going   PT LONG TERM GOAL #2   Title Patient will maintain correct posture 90%of the time in order to reduce pain and optimize overall function    Time 6   Period Weeks   Status On-going   PT LONG TERM GOAL #3   Title Patient to report that she has been able to sleep through the night waking up no more than 1 time due to pain in order to enhance sleep patterns and improve overall well being    Time 6   Period Weeks   Status On-going   PT LONG TERM GOAL #4   Title Patient to report that she has been able to stand to do the dishes and go grocery shopping with pain in her neck no more than 2/10 in order to enhance function at home and in community    Baseline 2/7- doing grocery shopping but not often; is doing doing dishes more often. Still having high levels of pain.    Time 6   Period Weeks   Status On-going               Plan - 05/08/15 1822    Clinical Impression Statement Pt with overall improvment in cervical symptoms, now with some lower back discomfort.  Added decompression exercises to help with overall spinal decompression.  Pt able to complete with therapist facilitaiton for form.  Reduction of symptoms at end of session.  Some tightness in Lt upper trap, otherwise no spasms palpated with manual techniques.  PT given written instructions to add to HEP.    Pt will benefit from skilled therapeutic intervention in order to improve on the following deficits Hypomobility;Decreased strength;Pain;Increased muscle spasms;Improper body mechanics;Impaired flexibility;Postural dysfunction   Rehab Potential Good   PT Frequency 2x / week   PT Duration 4 weeks   PT Treatment/Interventions ADLs/Self Care Home Management;Therapeutic activities;Therapeutic exercise;Patient/family education;Manual techniques;Moist Heat;Passive range of motion   PT Next Visit Plan cotinue with postural training progressing to decompression with theraband next session.  Complete manual as needed for  pain and spasm and continue to avoid PAs (possible positive VA test per evaluating therapist).    PT Home Exercise Plan postural 3 with red theraband   Consulted and Agree with Plan of Care Patient  Problem List Patient Active Problem List   Diagnosis Date Noted  . Lumbago 09/13/2011  . Pain in thoracic spine 09/13/2011  . Muscle weakness (generalized) 09/13/2011  . Back pain 09/01/2011  . Shoulder impingement 09/01/2011  . DDD (degenerative disc disease), lumbar 09/01/2011  . Arthritis of knee 08/05/2011  . Knee bursitis, left 08/05/2011  . Knee bursitis 08/05/2011  . Arthritis of knee, degenerative 08/05/2011  . Hip pain, right 06/02/2011  . Disorders of bursae and tendons in shoulder region, unspecified 11/05/2010  . DEGENERATIVE DISC DISEASE, CERVICAL SPINE 01/14/2010  . OTH SPEC D/O ROTATOR CUFF SYND SHLDR&ALLIED D/O 01/14/2010  . IMPINGEMENT SYNDROME 01/14/2010  . TOBACCO ABUSE 08/15/2009  . OTHER MALAISE AND FATIGUE 08/14/2009  . CHEST PAIN 08/14/2009  . ANEMIA 08/12/2009  . ANXIETY DISORDER 08/12/2009  . DEPRESSION 08/12/2009  . HYPERTENSION 08/12/2009  . FIBROMYALGIA 08/12/2009  . IRRITABLE BOWEL SYNDROME, HX OF 08/12/2009    Teena Irani, PTA/CLT [phone]  05/08/2015, 6:27 PM  [place], [name], [address] Phone: [phone]   Fax:  [phone]  Name: Kiara [name] MRN: [id number] Date of Birth: 04-16-1940

## 2015-05-13 ENCOUNTER — Ambulatory Visit (HOSPITAL_COMMUNITY): Payer: Medicare Other

## 2015-05-13 DIAGNOSIS — M436 Torticollis: Secondary | ICD-10-CM | POA: Diagnosis not present

## 2015-05-13 DIAGNOSIS — M542 Cervicalgia: Secondary | ICD-10-CM

## 2015-05-13 DIAGNOSIS — R293 Abnormal posture: Secondary | ICD-10-CM

## 2015-05-13 DIAGNOSIS — M6281 Muscle weakness (generalized): Secondary | ICD-10-CM

## 2015-05-13 NOTE — Therapy (Signed)
[place], [name], [address] Phone: [phone]   Fax:  [phone]  Physical Therapy Treatment  Patient Details  Name: Kiara [name] MRN: [id number] Date of Birth: 08/21/1940 Referring Provider: Dr. Aline Brochure   Encounter Date: 05/13/2015      PT End of Session - 05/13/15 1518    Visit Number 8   Number of Visits 14   Date for PT Re-Evaluation 06/03/15   Authorization Type Medicare/ Medicaid    Authorization Time Period 04/10/15 to 06/08/15   Authorization - Visit Number 8   Authorization - Number of Visits 16   PT Start Time 8502   PT Stop Time 1515   PT Time Calculation (min) 41 min   Activity Tolerance Patient tolerated treatment well   Behavior During Therapy [facility] for tasks assessed/performed      Past Medical History  Diagnosis Date  . Fibromyalgia   . Chronic fatigue   . AC (acromioclavicular) joint bone spurs   . Arthritis   . Sciatica   . Vasculitis ([place])   . Rheumatoid arthritis(714.0)     Past Surgical History  Procedure Laterality Date  . Back surgery      3 back surgeries  . Cholecystectomy    . Abdominal hysterectomy    . Anus surgery    . Left knee    . Tonsillectomy    . Tubal ligation      There were no vitals filed for this visit.  Visit Diagnosis:  Neck pain  Poor posture  Muscle weakness  Neck stiffness      Subjective Assessment - 05/13/15 1426    Subjective Pt stated increased sensativity at night, requires switching pillows throughout night due to increased tenderness on scalp.  No reports of pain today, stiffness in neck no reports of LBP today.   Pertinent History Patient reports that she cannot remember doing anything or having any injury that started her neck pain; she believes that she might have carried too many heavy things or lifted too much weight. This all started around 5 years ago.    Currently in Pain? No/denies   Pain Descriptors / Indicators --   stiffness             [facility] Adult PT Treatment/Exercise - 05/13/15 0001    Neck Exercises: Seated   Neck Retraction 10 reps;Other (comment)   Other Seated Exercise cervical excursion after manual    Shoulder Exercises: Supine   Other Supine Exercises decompression ex with RTB 5x each   Manual Therapy   Manual Therapy Soft tissue mobilization   Manual therapy comments manual techniques performed separately from therapeutic exercises at end of session.   Soft tissue mobilization supiine position with LE elevated upper traps, levator scapula             PT Short Term Goals - 05/06/15 1421    PT SHORT TERM GOAL #1   Title Patient to improve cervical ROM by at least 15-20 degrees on all planes in order to improve overall function and reduce pain    Time 3   Period Weeks   Status On-going   PT SHORT TERM GOAL #2   Title Patient to report that she has not woken up no more than twice during the night due to pain in order to improve her sleeping habits and overall well being    Baseline 2/7- still waking up at night due to pillow more than 2x.  Time 3   Period Weeks   Status On-going   PT SHORT TERM GOAL #3   Title Patient will demonstrate proper posture at least 75% of the time in order to reduce pain and enhance overall function    Time 3   Period Weeks   Status On-going   PT SHORT TERM GOAL #4   Title Patient to be independent in correctly and consistently performing appropriate HEP, to be updated PRN    Baseline 2/7- doing majority of the time consistently    Time 1   Period Weeks   Status Partially Met           PT Long Term Goals - 05/06/15 1424    PT LONG TERM GOAL #1   Title Patient will demonstrate at least 4+/5 strength in all tested muscles in order to reduce pain, enhance regional stability, and imrpove functional task performance    Time 6   Period Weeks   Status On-going   PT LONG TERM GOAL #2   Title Patient will maintain correct posture 90%of  the time in order to reduce pain and optimize overall function    Time 6   Period Weeks   Status On-going   PT LONG TERM GOAL #3   Title Patient to report that she has been able to sleep through the night waking up no more than 1 time due to pain in order to enhance sleep patterns and improve overall well being    Time 6   Period Weeks   Status On-going   PT LONG TERM GOAL #4   Title Patient to report that she has been able to stand to do the dishes and go grocery shopping with pain in her neck no more than 2/10 in order to enhance function at home and in community    Baseline 2/7- doing grocery shopping but not often; is doing doing dishes more often. Still having high levels of pain.    Time 6   Period Weeks   Status On-going               Plan - 05/13/15 1522    Clinical Impression Statement Progressed posture strengthening with decompression exercises with theraband resistance this session.  Pt able to complete with therapist facilitation for form and technique.  Reviewed compliance and confidence with theraband HEP given last session.  Ended session with manual soft tissue mobilization techniques to reduce tightness in upper trapezius, no spasms palpated.  Improved cervical  mobility noted following manual.  No reports of pain through session.     PT Next Visit Plan Continue with postural strengthneing exercises.  Complete manual as needed for pain and spasm and continue to avoid PAs (possible positive VA test per evaluating therapist).         Problem List Patient Active Problem List   Diagnosis Date Noted  . Lumbago 09/13/2011  . Pain in thoracic spine 09/13/2011  . Muscle weakness (generalized) 09/13/2011  . Back pain 09/01/2011  . Shoulder impingement 09/01/2011  . DDD (degenerative disc disease), lumbar 09/01/2011  . Arthritis of knee 08/05/2011  . Knee bursitis, left 08/05/2011  . Knee bursitis 08/05/2011  . Arthritis of knee, degenerative 08/05/2011  . Hip  pain, right 06/02/2011  . Disorders of bursae and tendons in shoulder region, unspecified 11/05/2010  . DEGENERATIVE DISC DISEASE, CERVICAL SPINE 01/14/2010  . OTH SPEC D/O ROTATOR CUFF SYND SHLDR&ALLIED D/O 01/14/2010  . IMPINGEMENT SYNDROME 01/14/2010  . TOBACCO ABUSE 08/15/2009  .  OTHER MALAISE AND FATIGUE 08/14/2009  . CHEST PAIN 08/14/2009  . ANEMIA 08/12/2009  . ANXIETY DISORDER 08/12/2009  . DEPRESSION 08/12/2009  . HYPERTENSION 08/12/2009  . FIBROMYALGIA 08/12/2009  . IRRITABLE BOWEL SYNDROME, HX OF 08/12/2009   Ihor Austin, LPTA; [place]  Aldona Lento 05/13/2015, 3:31 PM  [place], [name], [address] Phone: [phone]   Fax:  [phone]  Name: Kiara [name] MRN: [id number] Date of Birth: 07-Aug-1940

## 2015-05-15 ENCOUNTER — Ambulatory Visit (HOSPITAL_COMMUNITY): Payer: Medicare Other | Admitting: Physical Therapy

## 2015-05-20 ENCOUNTER — Ambulatory Visit (HOSPITAL_COMMUNITY): Payer: Medicare Other | Admitting: Physical Therapy

## 2015-05-22 ENCOUNTER — Telehealth (HOSPITAL_COMMUNITY): Payer: Self-pay | Admitting: Physical Therapy

## 2015-05-22 ENCOUNTER — Encounter (HOSPITAL_COMMUNITY): Payer: Medicare Other | Admitting: Physical Therapy

## 2015-05-22 NOTE — Telephone Encounter (Signed)
She is sick and weak can not come in

## 2015-05-26 ENCOUNTER — Telehealth (HOSPITAL_COMMUNITY): Payer: Self-pay | Admitting: Physical Therapy

## 2015-05-26 NOTE — Telephone Encounter (Signed)
She wants to be D/C due to chronic Back Pain.Marland Kitchen...will see MD soon. NF

## 2015-05-27 ENCOUNTER — Ambulatory Visit (HOSPITAL_COMMUNITY): Payer: Medicare Other | Admitting: Physical Therapy

## 2015-05-29 ENCOUNTER — Encounter (HOSPITAL_COMMUNITY): Payer: Medicare Other

## 2015-06-03 ENCOUNTER — Encounter (HOSPITAL_COMMUNITY): Payer: Medicare Other | Admitting: Physical Therapy

## 2015-06-05 ENCOUNTER — Encounter (HOSPITAL_COMMUNITY): Payer: Medicare Other | Admitting: Physical Therapy

## 2015-07-31 ENCOUNTER — Encounter (HOSPITAL_COMMUNITY): Payer: Self-pay | Admitting: Physical Therapy

## 2015-07-31 NOTE — Therapy (Signed)
[place], [name], [address] Phone: [phone]   Fax:  [phone]  Patient Details  Name: Kiara [name] MRN: [id number] Date of Birth: 1940/09/26 Referring Provider:  No ref. provider found  Encounter Date: 07/31/2015   PHYSICAL THERAPY DISCHARGE SUMMARY  Visits from Start of Care: 8  Current functional level related to goals / functional outcomes: Patient requested DC    Remaining deficits: Unable to assess    Education / Equipment: N/A  Plan: Patient agrees to discharge.  Patient goals were not met. Patient is being discharged due to the patient's request.  ?????        Deniece Ree PT, DPT [place], [name], [address] Phone: [phone]   Fax:  [phone]

## 2015-08-05 ENCOUNTER — Encounter (HOSPITAL_COMMUNITY): Payer: Self-pay | Admitting: Physical Therapy

## 2015-08-12 ENCOUNTER — Ambulatory Visit (INDEPENDENT_AMBULATORY_CARE_PROVIDER_SITE_OTHER): Payer: Medicare Other | Admitting: Orthopedic Surgery

## 2015-08-12 VITALS — BP 159/71 | HR 73 | Ht 65.0 in | Wt 183.0 lb

## 2015-08-12 DIAGNOSIS — M75102 Unspecified rotator cuff tear or rupture of left shoulder, not specified as traumatic: Secondary | ICD-10-CM | POA: Diagnosis not present

## 2015-08-12 DIAGNOSIS — M7542 Impingement syndrome of left shoulder: Secondary | ICD-10-CM

## 2015-08-12 NOTE — Progress Notes (Signed)
Chief Complaint  Patient presents with  . Shoulder Pain    Reoccuring left shoulder and arm pain    Prior history and review of systems noted from December 2016 updated no changes  Chief complaint neck pain and stiffness bilateral shoulder pain left greater than right with painful flexion of the left shoulder. Symptoms pain, locking, stiffness, giving way with numbness and tingling left shoulder arm right shoulder. The pain described as dull aching and radiating, constant, 6 out of 10. Currently on hydrocodone aspirin also uses a heating pad. Pain is worse when she lays on her left side when she picks things up when she's carrying groceries or turning over in bed and moving her arm across her body  Review of systems recent fever and chills fatigue tendinitis, sinusitis, shortness of breath, heart palpitations and frequent. Temperature intolerance. Depression anxiety loss of bladder control and frequent urination. Abdominal distention. Numbness and tingling with burning pain in the legs weakness and tremors  She comes in today mainly wanting her left shoulder checked out. She has pain in the left shoulder lateral deltoid anterior joint line radiates down into the upper part of her left arm down towards the elbow and over the deltoid with painful forward elevation and weakness with lifting above her head pain worse at night. Pain described as dull throbbing aching. No new trauma is noted.  Past Medical History  Diagnosis Date  . Fibromyalgia   . Chronic fatigue   . AC (acromioclavicular) joint bone spurs   . Arthritis   . Sciatica   . Vasculitis (HCC)   . Rheumatoid arthritis(714.0)    She is artery had a cortisone injection she has a cervical spine spondylosis the x-ray did not show any fracture dislocation or arthritic changes.  BP 159/71 mmHg  Pulse 73  Ht 5\' 5"  (1.651 m)  Wt 183 lb (83.008 kg)  BMI 30.45 kg/m2 Today we find her pleasant alert and awake oriented 3 with normal  grooming and hygiene. She is in a pleasant mood or affect is normal. She walks without support of gait assistance and no limping.  She has tenderness in the lateral deltoid posterior inferior acromion anterior joint line. Her active flexion is 120 passive 150 and painful. She is stable in external rotation and abduction. She has good strength in the internal and internal rotators and mild weakness in the rotator cuff. Her skin is warm dry and intact without laceration ulceration or erythema over the left shoulder. The cardiovascular findings of the left arm include no edema or swelling and normal pulse and temperature. Axilla and supraclavicular regions and cervical spine have normal lymph nodes and she has normal sensation in the left arm  Scars provocative test goes again apprehension test is negative she does have a positive impingement sign it 120 and the mild weakness was noted drop test is negative.  Again the plain films did not show arthritis fracture dislocation or bone lesion  Impression she either has a rotator cuff partial tear or just impingement  She's had symptoms now for 5 months and I would like to do an MRI of the shoulder to see if we need to go in surgically and do a decompression versus a cuff repair.  Patient be scheduled for MRI left shoulder to check for rotator cuff tear

## 2015-08-12 NOTE — Patient Instructions (Signed)
WE WILL SCHEDULE MRI FOR YOU AND CALL YOU WITH APPT 

## 2015-08-15 ENCOUNTER — Ambulatory Visit (HOSPITAL_COMMUNITY)
Admission: RE | Admit: 2015-08-15 | Discharge: 2015-08-15 | Disposition: A | Discharge status: Home / Self Care | Payer: Medicare Other | Source: Ambulatory Visit | Attending: Orthopedic Surgery | Admitting: Orthopedic Surgery

## 2015-08-15 DIAGNOSIS — M19012 Primary osteoarthritis, left shoulder: Secondary | ICD-10-CM | POA: Diagnosis not present

## 2015-08-15 DIAGNOSIS — M75102 Unspecified rotator cuff tear or rupture of left shoulder, not specified as traumatic: Secondary | ICD-10-CM

## 2015-08-15 DIAGNOSIS — M25512 Pain in left shoulder: Secondary | ICD-10-CM | POA: Diagnosis present

## 2015-08-18 ENCOUNTER — Ambulatory Visit (INDEPENDENT_AMBULATORY_CARE_PROVIDER_SITE_OTHER): Payer: Medicare Other | Admitting: Orthopedic Surgery

## 2015-08-18 ENCOUNTER — Encounter: Payer: Self-pay | Admitting: Orthopedic Surgery

## 2015-08-18 VITALS — BP 102/69 | Ht 65.0 in | Wt 183.0 lb

## 2015-08-18 DIAGNOSIS — M75102 Unspecified rotator cuff tear or rupture of left shoulder, not specified as traumatic: Secondary | ICD-10-CM

## 2015-08-18 DIAGNOSIS — M7542 Impingement syndrome of left shoulder: Secondary | ICD-10-CM

## 2015-08-18 NOTE — Progress Notes (Signed)
Patient ID: Kiara [name], female   DOB: January 01, 1941, 75 y.o.   MRN: [id number]  MRI FOLLOW UP  Chief Complaint  Patient presents with  . Follow-up    MRI REVIEW LEFT SHOULDER    HPI Kiara [name] is a 75 y.o. female.  With a history of left shoulder pain unresponsive to cortisone injection and oral analgesics  MRI OF THE left shoulder  Review of Systems  HENT:       Sleep apnea fatigue    Physical Exam  Constitutional: She is oriented to person, place, and time. She appears well-developed and well-nourished. No distress.  Cardiovascular: Intact distal pulses.   Neurological: She is alert and oriented to person, place, and time.  Skin: Skin is warm and dry. No rash noted. She is not diaphoretic. No erythema. No pallor.  Psychiatric: She has a normal mood and affect.   BP 102/69 mmHg  Ht 5\' 5"  (1.651 m)  Wt 183 lb (83.008 kg)  BMI 30.45 kg/m2  As noted on prior physical examinations patient has weakness and pain in the left shoulder with painful impingement sign  Data  Independent image interpretation the MRI showed MRI does not show rotator cuff tear  The report was read as follows   IMPRESSION: Mild to moderate appearing rotator cuff tendinopathy is most notable in the supraspinatus. No tear.  Mild acromioclavicular osteoarthritis.   Electronically Signed  By: M.D.  On: 08/15/2015 16:29  The plan is to proceed with arthroscopy left shoulder debridement and subacromial decompression. This procedure has been discussed with the patient she will get back to 08/17/2015 on the timing.

## 2016-09-04 ENCOUNTER — Emergency Department (HOSPITAL_COMMUNITY): Payer: Medicare Other

## 2016-09-04 ENCOUNTER — Encounter (HOSPITAL_COMMUNITY): Payer: Self-pay

## 2016-09-04 ENCOUNTER — Inpatient Hospital Stay (HOSPITAL_COMMUNITY)
Admission: EM | Admit: 2016-09-04 | Discharge: 2016-09-06 | DRG: 558 | Disposition: A | Discharge status: Home / Self Care | Payer: Medicare Other | Attending: Family Medicine | Admitting: Family Medicine

## 2016-09-04 DIAGNOSIS — I700 Atherosclerosis of aorta: Secondary | ICD-10-CM | POA: Diagnosis not present

## 2016-09-04 DIAGNOSIS — R748 Abnormal levels of other serum enzymes: Secondary | ICD-10-CM | POA: Diagnosis present

## 2016-09-04 DIAGNOSIS — Z888 Allergy status to other drugs, medicaments and biological substances status: Secondary | ICD-10-CM | POA: Diagnosis not present

## 2016-09-04 DIAGNOSIS — R531 Weakness: Secondary | ICD-10-CM | POA: Diagnosis not present

## 2016-09-04 DIAGNOSIS — M6282 Rhabdomyolysis: Principal | ICD-10-CM | POA: Diagnosis present

## 2016-09-04 DIAGNOSIS — E860 Dehydration: Secondary | ICD-10-CM

## 2016-09-04 DIAGNOSIS — D509 Iron deficiency anemia, unspecified: Secondary | ICD-10-CM | POA: Diagnosis present

## 2016-09-04 DIAGNOSIS — Z886 Allergy status to analgesic agent status: Secondary | ICD-10-CM | POA: Diagnosis not present

## 2016-09-04 DIAGNOSIS — M069 Rheumatoid arthritis, unspecified: Secondary | ICD-10-CM | POA: Diagnosis present

## 2016-09-04 DIAGNOSIS — I10 Essential (primary) hypertension: Secondary | ICD-10-CM | POA: Diagnosis present

## 2016-09-04 DIAGNOSIS — N390 Urinary tract infection, site not specified: Secondary | ICD-10-CM | POA: Diagnosis not present

## 2016-09-04 DIAGNOSIS — Z90710 Acquired absence of both cervix and uterus: Secondary | ICD-10-CM

## 2016-09-04 DIAGNOSIS — M797 Fibromyalgia: Secondary | ICD-10-CM | POA: Diagnosis present

## 2016-09-04 DIAGNOSIS — N3000 Acute cystitis without hematuria: Secondary | ICD-10-CM | POA: Diagnosis not present

## 2016-09-04 DIAGNOSIS — R5383 Other fatigue: Secondary | ICD-10-CM | POA: Diagnosis present

## 2016-09-04 DIAGNOSIS — E876 Hypokalemia: Secondary | ICD-10-CM

## 2016-09-04 DIAGNOSIS — D649 Anemia, unspecified: Secondary | ICD-10-CM | POA: Diagnosis present

## 2016-09-04 DIAGNOSIS — N3001 Acute cystitis with hematuria: Secondary | ICD-10-CM | POA: Diagnosis not present

## 2016-09-04 LAB — CBC WITH DIFFERENTIAL/PLATELET
Basophils Absolute: 0 10*3/uL (ref 0.0–0.1)
Basophils Relative: 0 %
Eosinophils Absolute: 0 10*3/uL (ref 0.0–0.7)
Eosinophils Relative: 0 %
HCT: 30.5 % — ABNORMAL LOW (ref 36.0–46.0)
Hemoglobin: 9.8 g/dL — ABNORMAL LOW (ref 12.0–15.0)
Lymphocytes Relative: 30 %
Lymphs Abs: 1.5 10*3/uL (ref 0.7–4.0)
MCH: 20 pg — ABNORMAL LOW (ref 26.0–34.0)
MCHC: 32.1 g/dL (ref 30.0–36.0)
MCV: 62.1 fL — ABNORMAL LOW (ref 78.0–100.0)
Monocytes Absolute: 0.5 10*3/uL (ref 0.1–1.0)
Monocytes Relative: 11 %
Neutro Abs: 3 10*3/uL (ref 1.7–7.7)
Neutrophils Relative %: 59 %
Platelets: 177 10*3/uL (ref 150–400)
RBC: 4.91 MIL/uL (ref 3.87–5.11)
RDW: 15.2 % (ref 11.5–15.5)
WBC: 5.1 10*3/uL (ref 4.0–10.5)

## 2016-09-04 LAB — CK
Total CK: 727 U/L — ABNORMAL HIGH (ref 38–234)
Total CK: 1005 U/L — ABNORMAL HIGH (ref 38–234)

## 2016-09-04 LAB — URINALYSIS, ROUTINE W REFLEX MICROSCOPIC
Bilirubin Urine: NEGATIVE
Glucose, UA: NEGATIVE mg/dL
Ketones, ur: 5 mg/dL — AB
Nitrite: POSITIVE — AB
Protein, ur: 100 mg/dL — AB
Specific Gravity, Urine: 1.014 (ref 1.005–1.030)
pH: 5 (ref 5.0–8.0)

## 2016-09-04 LAB — TROPONIN I: Troponin I: <0.03 ng/mL (ref <0.03)

## 2016-09-04 LAB — COMPREHENSIVE METABOLIC PANEL
ALT: 65 U/L — ABNORMAL HIGH (ref 14–54)
AST: 71 U/L — ABNORMAL HIGH (ref 15–41)
Albumin: 3.9 g/dL (ref 3.5–5.0)
Alkaline Phosphatase: 62 U/L (ref 38–126)
Anion gap: 11 (ref 5–15)
BUN: 13 mg/dL (ref 6–20)
CO2: 25 mmol/L (ref 22–32)
Calcium: 8.6 mg/dL — ABNORMAL LOW (ref 8.9–10.3)
Chloride: 98 mmol/L — ABNORMAL LOW (ref 101–111)
Creatinine, Ser: 0.88 mg/dL (ref 0.44–1.00)
GFR calc Af Amer: >60 mL/min (ref >60)
GFR calc non Af Amer: >60 mL/min (ref >60)
Glucose, Bld: 142 mg/dL — ABNORMAL HIGH (ref 65–99)
Potassium: 3.3 mmol/L — ABNORMAL LOW (ref 3.5–5.1)
Sodium: 134 mmol/L — ABNORMAL LOW (ref 135–145)
Total Bilirubin: 0.7 mg/dL (ref 0.3–1.2)
Total Protein: 7.1 g/dL (ref 6.5–8.1)

## 2016-09-04 LAB — LACTIC ACID, PLASMA: Lactic Acid, Venous: 1.3 mmol/L (ref 0.5–1.9)

## 2016-09-04 MED ORDER — HYDROCODONE-ACETAMINOPHEN 10-325 MG PO TABS
1.0000 | ORAL_TABLET | Freq: Two times a day (BID) | ORAL | Status: DC | PRN
  Administered 2016-09-04 – 2016-09-06 (×4): 1 via ORAL
  Filled 2016-09-04 (×4): qty 1

## 2016-09-04 MED ORDER — POTASSIUM CHLORIDE CRYS ER 20 MEQ PO TBCR
40.0000 meq | EXTENDED_RELEASE_TABLET | Freq: Once | ORAL | Status: AC
  Administered 2016-09-04: 40 meq via ORAL
  Filled 2016-09-04: qty 2

## 2016-09-04 MED ORDER — CEFTRIAXONE SODIUM 1 G IJ SOLR
1.0000 g | Freq: Once | INTRAMUSCULAR | Status: AC
  Administered 2016-09-04: 1 g via INTRAVENOUS
  Filled 2016-09-04: qty 10

## 2016-09-04 MED ORDER — PANTOPRAZOLE SODIUM 40 MG PO TBEC
40.0000 mg | DELAYED_RELEASE_TABLET | Freq: Every day | ORAL | Status: DC
  Administered 2016-09-04 – 2016-09-06 (×2): 40 mg via ORAL
  Filled 2016-09-04 (×3): qty 1

## 2016-09-04 MED ORDER — ALPRAZOLAM 0.5 MG PO TABS
0.5000 mg | ORAL_TABLET | Freq: Every evening | ORAL | Status: DC | PRN
  Administered 2016-09-04: 0.5 mg via ORAL
  Filled 2016-09-04: qty 1

## 2016-09-04 MED ORDER — SODIUM CHLORIDE 0.9% FLUSH: 3.0000 mL | Freq: Two times a day (BID) | INTRAVENOUS | Status: DC

## 2016-09-04 MED ORDER — ONDANSETRON HCL 4 MG PO TABS: 4.0000 mg | ORAL_TABLET | Freq: Four times a day (QID) | ORAL | Status: DC | PRN

## 2016-09-04 MED ORDER — ONDANSETRON HCL 4 MG/2ML IJ SOLN: 4.0000 mg | Freq: Four times a day (QID) | INTRAMUSCULAR | Status: DC | PRN

## 2016-09-04 MED ORDER — AMITRIPTYLINE HCL 25 MG PO TABS
50.0000 mg | ORAL_TABLET | Freq: Every day | ORAL | Status: DC
  Administered 2016-09-04 – 2016-09-05 (×2): 50 mg via ORAL
  Filled 2016-09-04 (×2): qty 2

## 2016-09-04 MED ORDER — ACETAMINOPHEN 650 MG RE SUPP: 650.0000 mg | Freq: Four times a day (QID) | RECTAL | Status: DC | PRN

## 2016-09-04 MED ORDER — SODIUM CHLORIDE 0.9 % IV BOLUS (SEPSIS)
500.0000 mL | Freq: Once | INTRAVENOUS | Status: AC
  Administered 2016-09-04: 500 mL via INTRAVENOUS

## 2016-09-04 MED ORDER — DEXTROSE 5 % IV SOLN
1.0000 g | INTRAVENOUS | Status: DC
  Filled 2016-09-04: qty 10

## 2016-09-04 MED ORDER — SODIUM CHLORIDE 0.9 % IV SOLN
INTRAVENOUS | Status: DC
  Administered 2016-09-04 – 2016-09-06 (×5): via INTRAVENOUS

## 2016-09-04 MED ORDER — SENNOSIDES-DOCUSATE SODIUM 8.6-50 MG PO TABS: 1.0000 | ORAL_TABLET | Freq: Every evening | ORAL | Status: DC | PRN

## 2016-09-04 MED ORDER — METOPROLOL TARTRATE 50 MG PO TABS
50.0000 mg | ORAL_TABLET | Freq: Two times a day (BID) | ORAL | Status: DC
  Administered 2016-09-04 – 2016-09-06 (×4): 50 mg via ORAL
  Filled 2016-09-04 (×4): qty 1

## 2016-09-04 MED ORDER — ENOXAPARIN SODIUM 40 MG/0.4ML ~~LOC~~ SOLN
40.0000 mg | SUBCUTANEOUS | Status: DC
  Administered 2016-09-04 – 2016-09-05 (×2): 40 mg via SUBCUTANEOUS
  Filled 2016-09-04 (×2): qty 0.4

## 2016-09-04 MED ORDER — ACETAMINOPHEN 325 MG PO TABS
650.0000 mg | ORAL_TABLET | Freq: Four times a day (QID) | ORAL | Status: DC | PRN
  Filled 2016-09-04: qty 2

## 2016-09-04 MED ORDER — SODIUM CHLORIDE 0.9 % IV BOLUS (SEPSIS)
1000.0000 mL | Freq: Once | INTRAVENOUS | Status: AC
  Administered 2016-09-04: 1000 mL via INTRAVENOUS

## 2016-09-04 NOTE — ED Notes (Signed)
In and out cath performed by Baily Serpe,rn and Lenvil Swaim white, rn.

## 2016-09-04 NOTE — ED Triage Notes (Signed)
cbg 137 per ems

## 2016-09-04 NOTE — ED Provider Notes (Signed)
[facility]-EMERGENCY DEPT Provider Note   CSN: [id number] Arrival date & time: 09/04/16  1427     History   Chief Complaint Chief Complaint  Patient presents with  . Fatigue    HPI MANE CONSOLO is a 76 y.o. female.  HPI  Pt was seen at 1435. Per pt, c/o gradual onset and worsening of persistent generalized weakness/fatigue and "aching all over" for the past 1 week, worse over the past 2 days. Pt states she has been "crawling around on the floor," unable to walk for the past 2 days because of her symptoms. Pt denies syncope/LOC. Denies fevers, no focal motor weakness, no tingling/numbness in extremities, no CP/SOB, no abd pain, no N/V/D, no back pain.   Past Medical History:  Diagnosis Date  . AC (acromioclavicular) joint bone spurs   . Arthritis   . Chronic fatigue   . Fibromyalgia   . Rheumatoid arthritis(714.0)   . Sciatica   . Vasculitis [facility])     Patient Active Problem List   Diagnosis Date Noted  . Lumbago 09/13/2011  . Pain in thoracic spine 09/13/2011  . Muscle weakness (generalized) 09/13/2011  . Back pain 09/01/2011  . Shoulder impingement 09/01/2011  . DDD (degenerative disc disease), lumbar 09/01/2011  . Arthritis of knee 08/05/2011  . Knee bursitis, left 08/05/2011  . Knee bursitis 08/05/2011  . Arthritis of knee, degenerative 08/05/2011  . Hip pain, right 06/02/2011  . Disorders of bursae and tendons in shoulder region, unspecified 11/05/2010  . DEGENERATIVE DISC DISEASE, CERVICAL SPINE 01/14/2010  . OTH SPEC D/O ROTATOR CUFF SYND SHLDR&ALLIED D/O 01/14/2010  . IMPINGEMENT SYNDROME 01/14/2010  . TOBACCO ABUSE 08/15/2009  . OTHER MALAISE AND FATIGUE 08/14/2009  . CHEST PAIN 08/14/2009  . ANEMIA 08/12/2009  . ANXIETY DISORDER 08/12/2009  . DEPRESSION 08/12/2009  . HYPERTENSION 08/12/2009  . FIBROMYALGIA 08/12/2009  . IRRITABLE BOWEL SYNDROME, HX OF 08/12/2009    Past Surgical History:  Procedure Laterality Date  . ABDOMINAL HYSTERECTOMY    . ANUS  SURGERY    . BACK SURGERY     3 back surgeries  . CHOLECYSTECTOMY    . left knee    . TONSILLECTOMY    . TUBAL LIGATION      OB History    No data available       Home Medications    Prior to Admission medications   Medication Sig Start Date End Date Taking? Authorizing Provider  ALPRAZolam (XANAX PO) Take by mouth.      [provider]  amitriptyline (ELAVIL) 50 MG tablet Take 50 mg by mouth 2 (two) times daily.      [provider]  Celecoxib (CELEBREX PO) Take by mouth.    [provider]  hydrocodone-acetaminophen (LORCET PLUS) 7.5-650 MG per tablet Take 1 tablet by mouth every 6 (six) hours as needed.      [provider]  Ibuprofen 200 MG CAPS Take by mouth.      [provider]  METOPROLOL TARTRATE PO Take by mouth.      [provider]  omeprazole (PRILOSEC) 20 MG capsule  12/27/11   [provider]    Family History Family History  Problem Relation Age of Onset  . Heart disease Unknown   . Arthritis Unknown   . Diabetes Unknown     Social History Social History  Substance Use Topics  . Smoking status: Never Smoker  . Smokeless tobacco: Never Used  . Alcohol use No  Allergies   Celebrex [celecoxib]; Nsaids; and Sterapred [prednisone]   Review of Systems Review of Systems ROS: Statement: All systems negative except as marked or noted in the HPI; Constitutional: Negative for fever and chills. +generalized weakness/fatigue, generalized body aches.; ; Eyes: Negative for eye pain, redness and discharge. ; ; ENMT: Negative for ear pain, hoarseness, nasal congestion, sinus pressure and sore throat. ; ; Cardiovascular: Negative for chest pain, palpitations, diaphoresis, dyspnea and peripheral edema. ; ; Respiratory: Negative for cough, wheezing and stridor. ; ; Gastrointestinal: Negative for nausea, vomiting, diarrhea, abdominal pain, blood in stool, hematemesis, jaundice and rectal bleeding. . ; ;  Genitourinary: Negative for dysuria, flank pain and hematuria. ; ; Musculoskeletal: Negative for back pain and neck pain. Negative for swelling and trauma.; ; Skin: Negative for pruritus, rash, abrasions, blisters, bruising and skin lesion.; ; Neuro: Negative for headache, lightheadedness and neck stiffness. Negative for altered level of consciousness, altered mental status, extremity weakness, paresthesias, involuntary movement, seizure and syncope.     Physical Exam Updated Vital Signs BP (!) 163/78 (BP Location: Right Arm)   Pulse (!) 109   Temp 99.6 F (37.6 C) (Oral)   Resp 14   Ht 5\' 3"  (1.6 m)   Wt 80.3 kg (177 lb)   SpO2 94%   BMI 31.35 kg/m    14:42:38 Orthostatic Vital Signs MM  Orthostatic Lying   BP- Lying:  163/116  Pulse- Lying: 103      Orthostatic Sitting  BP- Sitting: 154/78  Pulse- Sitting: 108      Orthostatic Standing at 0 minutes  BP- Standing at 0 minutes:  166/100  Pulse- Standing at 0 minutes: 113     Physical Exam 1440: Physical examination:  Nursing notes reviewed; Vital signs and O2 SAT reviewed;  Constitutional: Well developed, Well nourished, In no acute distress; Head:  Normocephalic, atraumatic; Eyes: EOMI, PERRL, No scleral icterus; ENMT: Mouth and pharynx normal, Mucous membranes dry; Neck: Supple, Full range of motion, No lymphadenopathy; Cardiovascular: Tachycardic rate and rhythm, No gallop; Respiratory: Breath sounds clear & equal bilaterally, No wheezes.  Speaking full sentences with ease, Normal respiratory effort/excursion; Chest: Nontender, Movement normal; Abdomen: Soft, Nontender, Nondistended, Normal bowel sounds; Genitourinary: No CVA tenderness; Spine:  No midline CS, TS, LS tenderness.;; Extremities: Pulses normal, No tenderness, No edema, No calf edema or asymmetry.; Neuro: AA&Ox3, Major CN grossly intact. Speech clear.  No facial droop. Grips equal. Strength 5/5 equal bilat UE's and LE's.  DTR 2/4 equal bilat UE's and LE's.  No gross  sensory deficits.  Normal cerebellar testing bilat UE's (finger-nose) and LE's (heel-shin).; Skin: Color normal, Warm, Dry.   ED Treatments / Results  Labs (all labs ordered are listed, but only abnormal results are displayed)   EKG  EKG Interpretation  Date/Time:  Saturday September 04 2016 14:30:03 EDT Ventricular Rate:  108 PR Interval:    QRS Duration: 100 QT Interval:  338 QTC Calculation: 453 R Axis:   3 Text Interpretation:  Sinus tachycardia Minimal ST depression, lateral leads No old tracing to compare Confirmed by [facility]  MD, Nicholos Johns [phone]) on 09/04/2016 2:40:07 PM       Radiology   Procedures Procedures (including critical care time)  Medications Ordered in ED Medications - No data to display   Initial Impression / Assessment and Plan / ED Course  I have reviewed the triage vital signs and the nursing notes.  Pertinent labs & imaging results that were available during my care of the patient  were reviewed by me and considered in my medical decision making (see chart for details).  MDM Reviewed: previous chart, nursing note and vitals Reviewed previous: labs Interpretation: labs, ECG, x-ray and CT scan   Results for orders placed or performed during the hospital encounter of 09/04/16  Urinalysis, Routine w reflex microscopic  Result Value Ref Range   Color, Urine YELLOW YELLOW   APPearance HAZY (A) CLEAR   Specific Gravity, Urine 1.014 1.005 - 1.030   pH 5.0 5.0 - 8.0   Glucose, UA NEGATIVE NEGATIVE mg/dL   Hgb urine dipstick LARGE (A) NEGATIVE   Bilirubin Urine NEGATIVE NEGATIVE   Ketones, ur 5 (A) NEGATIVE mg/dL   Protein, ur 789 (A) NEGATIVE mg/dL   Nitrite POSITIVE (A) NEGATIVE   Leukocytes, UA TRACE (A) NEGATIVE   RBC / HPF 6-30 0 - 5 RBC/hpf   WBC, UA 6-30 0 - 5 WBC/hpf   Bacteria, UA MANY (A) NONE SEEN   Squamous Epithelial / LPF 0-5 (A) NONE SEEN   Mucous PRESENT    Hyaline Casts, UA PRESENT   CBC with Differential  Result Value Ref Range     WBC 5.1 4.0 - 10.5 K/uL   RBC 4.91 3.87 - 5.11 MIL/uL   Hemoglobin 9.8 (L) 12.0 - 15.0 g/dL   HCT 38.1 (L) 01.7 - 51.0 %   MCV 62.1 (L) 78.0 - 100.0 fL   MCH 20.0 (L) 26.0 - 34.0 pg   MCHC 32.1 30.0 - 36.0 g/dL   RDW 25.8 52.7 - 78.2 %   Platelets 177 150 - 400 K/uL   Neutrophils Relative % 59 %   Neutro Abs 3.0 1.7 - 7.7 K/uL   Lymphocytes Relative 30 %   Lymphs Abs 1.5 0.7 - 4.0 K/uL   Monocytes Relative 11 %   Monocytes Absolute 0.5 0.1 - 1.0 K/uL   Eosinophils Relative 0 %   Eosinophils Absolute 0.0 0.0 - 0.7 K/uL   Basophils Relative 0 %   Basophils Absolute 0.0 0.0 - 0.1 K/uL   WBC Morphology ATYPICAL LYMPHOCYTES   Troponin I  Result Value Ref Range   Troponin I <0.03 <0.03 ng/mL  Lactic acid, plasma  Result Value Ref Range   Lactic Acid, Venous 1.3 0.5 - 1.9 mmol/L  Comprehensive metabolic panel  Result Value Ref Range   Sodium 134 (L) 135 - 145 mmol/L   Potassium 3.3 (L) 3.5 - 5.1 mmol/L   Chloride 98 (L) 101 - 111 mmol/L   CO2 25 22 - 32 mmol/L   Glucose, Bld 142 (H) 65 - 99 mg/dL   BUN 13 6 - 20 mg/dL   Creatinine, Ser 4.23 0.44 - 1.00 mg/dL   Calcium 8.6 (L) 8.9 - 10.3 mg/dL   Total Protein 7.1 6.5 - 8.1 g/dL   Albumin 3.9 3.5 - 5.0 g/dL   AST 71 (H) 15 - 41 U/L   ALT 65 (H) 14 - 54 U/L   Alkaline Phosphatase 62 38 - 126 U/L   Total Bilirubin 0.7 0.3 - 1.2 mg/dL   GFR calc non Af Amer >60 >60 mL/min   GFR calc Af Amer >60 >60 mL/min   Anion gap 11 5 - 15  CK  Result Value Ref Range   Total CK 727 (H) 38 - 234 U/L   Dg Chest 2 View Result Date: 09/04/2016 CLINICAL DATA:  EMs reports pt c/o increased generalized weakness and aching all over x 1 week. Reports urinary incontinence. Pt says has had  chills intermittently. Pt says has been in the floor crawling to get around her house the past 2 days. Non-smoker. EXAM: CHEST  2 VIEW COMPARISON:  06/22/2013 FINDINGS: Numerous leads and wires project over the chest. Midline trachea. Mild cardiomegaly. Tortuous  thoracic aorta. Aortic atherosclerosis. Biapical pleural-parenchymal scarring. IMPRESSION: No acute cardiopulmonary disease. Cardiomegaly without congestive failure. Aortic atherosclerosis. Electronically Signed   By: Jeronimo Greaves M.D.   On: 09/04/2016 15:14   Ct Head Wo Contrast Result Date: 09/04/2016 CLINICAL DATA:  Generalized weakness and general body aches for 1 week EXAM: CT HEAD WITHOUT CONTRAST TECHNIQUE: Contiguous axial images were obtained from the base of the skull through the vertex without intravenous contrast. COMPARISON:  None. FINDINGS: Brain: Global atrophy. Lacune or infarcts in the left basal ganglia. There is no mass effect, midline shift, or acute intracranial hemorrhage. Vascular: No hyperdense vessel or unexpected calcification. Skull: Cranium is intact. Sinuses/Orbits: Mastoid air cells and visualized paranasal sinuses are clear pre Other: Noncontributory. IMPRESSION: Chronic ischemic changes and atrophy. No acute intracranial pathology. Electronically Signed   By: Jolaine Click M.D.   On: 09/04/2016 15:20    1615:  +UTI, UC pending; IV rocephin ordered. IVF bolus given for tachycardia and elevated total CK. H/H per baseline. LFT's mildly elevated, but abd remains benign on exam. Dx and testing d/w pt.  Questions answered.  Verb understanding, agreeable to admit. T/C to [facility]. Adrian Blackwater, case discussed, including:  HPI, pertinent PM/SHx, VS/PE, dx testing, ED course and treatment:  Agreeable to admit.   Final Clinical Impressions(s) / ED Diagnoses   Final diagnoses:  None    New Prescriptions New Prescriptions   No medications on file     Samuel Jester, DO 09/08/16 [id number]

## 2016-09-04 NOTE — H&P (Addendum)
History and Physical  Kiara [name] ZOX:[id number] DOB: [birth year] DOA: 09/04/2016  Referring physician: Clarene Duke, ED physician PCP: Gareth Morgan, MD  Outpatient Specialists:  none  Patient Coming From: home  Chief Complaint: weakness, fatigue  HPI: Kiara [name] is a 76 y.o. female with a history of ET, fibromyalgia, hypertension, arthritis. Patient seen for 1 week of gradually increasing weakness. Patient had subjective fever and chills 4 days ago and has been so weak that she has been unable to stand and walk over the past 2 days. She reports crawling along the floor and having to sleep on the floor because she was too weak to get up onto the couch or bed. She is unable to recall whether she has had anything to eat or drink and she does not recall much of the past 2 days. No palliating or provoking factors.  Emergency Department Course: Rectal shows evidence of UTI. CK mildly elevated at 700. Temperature elevated to 99. Patient given 1 g of Rocephin, 500 mL's of IV fluid bolus.  Review of Systems:   Pt denies any vomiting, diarrhea, constipation, abdominal pain, shortness of breath, dyspnea on exertion, orthopnea, cough, wheezing, palpitations, headache, vision changes, lightheadedness, dizziness, melena, rectal bleeding.  Review of systems are otherwise negative  Past Medical History:  Diagnosis Date  . AC (acromioclavicular) joint bone spurs   . Arthritis   . Chronic fatigue   . Fibromyalgia   . Rheumatoid arthritis(714.0)   . Sciatica   . 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 ABDOMINAL HYSTERECTOMY    . ANUS SURGERY    . BACK SURGERY     3 back surgeries  . CHOLECYSTECTOMY    . left knee    . TONSILLECTOMY    . TUBAL LIGATION     Social History:  reports that she has never smoked. She has never used smokeless tobacco. She reports that she does not drink alcohol or use drugs. Patient lives at Home  Allergies  Allergen Reactions    . Nsaids Other (See Comments)    Patient says large doses cause shortness of breath, but she can take small doses  . Celebrex [Celecoxib] Nausea Only and Other (See Comments)    Patient states it made her nauseated and short-of-breath; she also says it did not work well for her  . Gabapentin Other (See Comments)    Had shocking sensations in legs  . Sterapred [Prednisone] Other (See Comments)    Patient says it affects her negatively; side-effects patient says, have strange results on her    Family History  Problem Relation Age of Onset  . Heart disease Unknown   . Arthritis Unknown   . Diabetes Unknown       Prior to Admission medications   Medication Sig Start Date End Date Taking? Authorizing Provider  ALPRAZolam Prudy Feeler) 1 MG tablet Take 0.5-1 mg by mouth at bedtime as needed for anxiety.   Yes [provider]  amitriptyline (ELAVIL) 50 MG tablet Take 50 mg by mouth at bedtime.    Yes [provider]  b complex vitamins tablet Take 1 tablet by mouth daily.   Yes [provider]  HYDROcodone-acetaminophen (NORCO) 10-325 MG tablet Take 1 tablet by mouth 2 (two) times daily as needed for pain.    Yes [provider]  ibuprofen (ADVIL,MOTRIN) 200 MG tablet Take 200 mg by mouth daily as needed.    Yes [provider]  Multiple Vitamins-Minerals (MULTIVITAMIN ADULTS PO) Take 1 tablet by mouth daily.   Yes [provider]  omeprazole (PRILOSEC) 20 MG capsule Take 20 mg by mouth daily.  12/27/11  Yes [provider]  sodium chloride (OCEAN) 0.65 % SOLN nasal spray Place 1 spray into both nostrils as needed for congestion.   Yes [provider]  metoprolol tartrate (LOPRESSOR) 50 MG tablet Take by mouth 2 (two) times daily.     [provider]    Physical Exam: BP (!) 144/113   Pulse 96   Temp 99.6 F (37.6 C) (Oral)   Resp (!) 22   Ht 5\' 3"  (1.6 m)   Wt 80.3 kg (177 lb)   SpO2 98%   BMI 31.35 kg/m    General: Elderly Caucasian female. Awake and alert and oriented x3. No acute cardiopulmonary distress.  HEENT: Normocephalic atraumatic.  Right and left ears normal in appearance.  Pupils equal, round, reactive to light. Extraocular muscles are intact. Sclerae anicteric and noninjected.  Moist mucosal membranes. No mucosal lesions.  Neck: Neck supple without lymphadenopathy. No carotid bruits. No masses palpated.  Cardiovascular: Regular rate with normal S1-S2 sounds. No murmurs, rubs, gallops auscultated. No JVD.  Respiratory: Good respiratory effort with no wheezes, rales, rhonchi. Lungs clear to auscultation bilaterally.  No accessory muscle use. Abdomen: Soft, mild suprapubic tenderness, nondistended. Active bowel sounds. No masses or hepatosplenomegaly  Skin: No rashes, lesions, or ulcerations.  Dry, warm to touch. 2+ dorsalis pedis and radial pulses. Musculoskeletal: No calf or leg pain. All major joints not erythematous nontender.  No upper or lower joint deformation.  Good ROM.  No contractures  Psychiatric: Intact judgment and insight. Pleasant and cooperative. Neurologic: No focal neurological deficits. Strength is 5/5 and symmetric in upper and lower extremities.  Cranial nerves II through XII are grossly intact.           Labs on Admission: I have personally reviewed following labs and imaging studies  CBC:  Recent Labs Lab 09/04/16 1519  WBC 5.1  NEUTROABS 3.0  HGB 9.8*  HCT 30.5*  MCV 62.1*  PLT 177   Basic Metabolic Panel:  Recent Labs Lab 09/04/16 1519  NA 134*  K 3.3*  CL 98*  CO2 25  GLUCOSE 142*  BUN 13  CREATININE 0.88  CALCIUM 8.6*   GFR: Estimated Creatinine Clearance: 54.6 mL/min (by C-G formula based on SCr of 0.88 mg/dL). Liver Function Tests:  Recent Labs Lab 09/04/16 1519  AST 71*  ALT 65*  ALKPHOS 62  BILITOT 0.7  PROT 7.1  ALBUMIN 3.9   No results for input(s): LIPASE, AMYLASE in the last 168 hours. No results for input(s):  AMMONIA in the last 168 hours. Coagulation Profile: No results for input(s): INR, PROTIME in the last 168 hours. Cardiac Enzymes:  Recent Labs Lab 09/04/16 1519  CKTOTAL 727*  TROPONINI <0.03   BNP (last 3 results) No results for input(s): PROBNP in the last 8760 hours. HbA1C: No results for input(s): HGBA1C in the last 72 hours. CBG: No results for input(s): GLUCAP in the last 168 hours. Lipid Profile: No results for input(s): CHOL, HDL, LDLCALC, TRIG, CHOLHDL, LDLDIRECT in the last 72 hours. Thyroid Function Tests: No results for input(s): TSH, T4TOTAL, FREET4, T3FREE, THYROIDAB in the last 72 hours. Anemia Panel: No results for input(s): VITAMINB12, FOLATE, FERRITIN, TIBC, IRON, RETICCTPCT in the last 72 hours. Urine analysis:    Component Value Date/Time   COLORURINE YELLOW 09/04/2016 1450  APPEARANCEUR HAZY (A) 09/04/2016 1450   LABSPEC 1.014 09/04/2016 1450   PHURINE 5.0 09/04/2016 1450   GLUCOSEU NEGATIVE 09/04/2016 1450   HGBUR LARGE (A) 09/04/2016 1450   BILIRUBINUR NEGATIVE 09/04/2016 1450   KETONESUR 5 (A) 09/04/2016 1450   PROTEINUR 100 (A) 09/04/2016 1450   NITRITE POSITIVE (A) 09/04/2016 1450   LEUKOCYTESUR TRACE (A) 09/04/2016 1450   Sepsis Labs: @LABRCNTIP (procalcitonin:4,lacticidven:4) )No results found for this or any previous visit (from the past 240 hour(s)).   Radiological Exams on Admission: Dg Chest 2 View  Result Date: 09/04/2016 CLINICAL DATA:  EMs reports pt c/o increased generalized weakness and aching all over x 1 week. Reports urinary incontinence. Pt says has had chills intermittently. Pt says has been in the floor crawling to get around her house the past 2 days. Non-smoker. EXAM: CHEST  2 VIEW COMPARISON:  06/22/2013 FINDINGS: Numerous leads and wires project over the chest. Midline trachea. Mild cardiomegaly. Tortuous thoracic aorta. Aortic atherosclerosis. Biapical pleural-parenchymal scarring. IMPRESSION: No acute cardiopulmonary  disease. Cardiomegaly without congestive failure. Aortic atherosclerosis. Electronically Signed   By: 06/24/2013 M.D.   On: 09/04/2016 15:14   Ct Head Wo Contrast  Result Date: 09/04/2016 CLINICAL DATA:  Generalized weakness and general body aches for 1 week EXAM: CT HEAD WITHOUT CONTRAST TECHNIQUE: Contiguous axial images were obtained from the base of the skull through the vertex without intravenous contrast. COMPARISON:  None. FINDINGS: Brain: Global atrophy. Lacune or infarcts in the left basal ganglia. There is no mass effect, midline shift, or acute intracranial hemorrhage. Vascular: No hyperdense vessel or unexpected calcification. Skull: Cranium is intact. Sinuses/Orbits: Mastoid air cells and visualized paranasal sinuses are clear pre Other: Noncontributory. IMPRESSION: Chronic ischemic changes and atrophy. No acute intracranial pathology. Electronically Signed   By: 11/04/2016 M.D.   On: 09/04/2016 15:20    EKG: Independently reviewed. Sinus tachycardia. Minimal ST elevation, unchanged from previous. No acute ST changes.  Assessment/Plan: Principal Problem:   UTI (urinary tract infection) Active Problems:   ANEMIA   Essential hypertension   Hypokalemia   Elevated CK   Fatigue    This patient was discussed with the ED physician, including pertinent vitals, physical exam findings, labs, and imaging.  We also discussed care given by the ED provider.  #1 UTI  Admit - anticipate patient needing 2 days in the hospital for treatment  Continue Rocephin  Urine culture pending  CBC in the morning #2 elevated CK  Repeat CK this evening and tomorrow morning  We'll give an additional liter bolus of IV fluids over 2 hours, then continue IV fluids at 100 mL per hour #3 fatigue  Likely secondary to #1 and #2  PT consult #4 hypokalemia  Mild  40 mEq of potassium now  Check potassium in the morning #5 essential hypertension  Continue Lopressor #6 Anemia Chronic and  stable  DVT prophylaxis: Lovenox Consultants: None Code Status: Full code Family Communication: None  Disposition Plan: Admission to med surge   11/04/2016, DO [facility] Pager [phone]  If 7PM-7AM, please contact night-coverage [email or web address] Password [email or web address]

## 2016-09-04 NOTE — ED Triage Notes (Signed)
EMs reports pt c/o increased generalized weakness and aching all over x 1 week.  Reports urinary incontinence.  Pt says has had chills intermittently.  Pt says has been in the floor crawling to get around her house the past 2 days.

## 2016-09-04 NOTE — ED Notes (Signed)
Bed linens changed

## 2016-09-04 NOTE — ED Notes (Signed)
EKG given to Dr. McManus.  

## 2016-09-05 DIAGNOSIS — R531 Weakness: Secondary | ICD-10-CM

## 2016-09-05 DIAGNOSIS — I700 Atherosclerosis of aorta: Secondary | ICD-10-CM

## 2016-09-05 DIAGNOSIS — D509 Iron deficiency anemia, unspecified: Secondary | ICD-10-CM

## 2016-09-05 DIAGNOSIS — N3001 Acute cystitis with hematuria: Secondary | ICD-10-CM

## 2016-09-05 DIAGNOSIS — M6282 Rhabdomyolysis: Principal | ICD-10-CM

## 2016-09-05 DIAGNOSIS — R748 Abnormal levels of other serum enzymes: Secondary | ICD-10-CM

## 2016-09-05 DIAGNOSIS — N3000 Acute cystitis without hematuria: Secondary | ICD-10-CM

## 2016-09-05 LAB — CBC
HCT: 30 % — ABNORMAL LOW (ref 36.0–46.0)
Hemoglobin: 9.5 g/dL — ABNORMAL LOW (ref 12.0–15.0)
MCH: 19.8 pg — ABNORMAL LOW (ref 26.0–34.0)
MCHC: 31.7 g/dL (ref 30.0–36.0)
MCV: 62.5 fL — ABNORMAL LOW (ref 78.0–100.0)
Platelets: 166 10*3/uL (ref 150–400)
RBC: 4.8 MIL/uL (ref 3.87–5.11)
RDW: 15.5 % (ref 11.5–15.5)
WBC: 4.9 10*3/uL (ref 4.0–10.5)

## 2016-09-05 LAB — CK: Total CK: 886 U/L — ABNORMAL HIGH (ref 38–234)

## 2016-09-05 LAB — BASIC METABOLIC PANEL
Anion gap: 8 (ref 5–15)
BUN: 10 mg/dL (ref 6–20)
CO2: 26 mmol/L (ref 22–32)
Calcium: 8.5 mg/dL — ABNORMAL LOW (ref 8.9–10.3)
Chloride: 103 mmol/L (ref 101–111)
Creatinine, Ser: 0.7 mg/dL (ref 0.44–1.00)
GFR calc Af Amer: >60 mL/min (ref >60)
GFR calc non Af Amer: >60 mL/min (ref >60)
Glucose, Bld: 102 mg/dL — ABNORMAL HIGH (ref 65–99)
Potassium: 3.7 mmol/L (ref 3.5–5.1)
Sodium: 137 mmol/L (ref 135–145)

## 2016-09-05 MED ORDER — CEFUROXIME AXETIL 250 MG PO TABS
500.0000 mg | ORAL_TABLET | Freq: Two times a day (BID) | ORAL | Status: DC
  Administered 2016-09-05 – 2016-09-06 (×2): 500 mg via ORAL
  Filled 2016-09-05 (×3): qty 2

## 2016-09-05 NOTE — Progress Notes (Signed)
  PROGRESS NOTE  Kiara [name] LEX:[id number] DOB: Aug 30, 1940 DOA: 09/04/2016 PCP: Gareth Morgan, MD  Brief Narrative: 76 year old woman presented with one-week history of gradually increasing weakness, subjective fever, chills, inability to stand or walk for 2 days. Reported crawling on the floor and sleeping on the floor due to being too weak to get up onto the couch or bed. Unable to recall whether has had 19 either drink for the last 2 days. Admitted for UTI, rhabdomyolysis/elevated CK.  Assessment/Plan #1: UTI with associated generalized weakness and elevated CK. Afebrile, VSS. -continue abx, f/u culture data  2: Rhabdomyolysis -no improvement in CPK, will increase IVF, followup BMP and CK in AM  3: Generalized weakness. No focal neurologic deficit.  -expect improvement with IVF and treatment of UTI  4: Chronic microcytic anemia, at baseline. -Follow-up as an outpatient.  5: Hypokalemia. -Resolved.  6: Aortic atherosclerosis. Asymptomatic.   PMH: Fibromyalgia, rheumatoid arthritis, vasculitis, chronic fatigue   Improving, likely home in 24 hours. Consult PT 6/11  DVT prophylaxis: enoxaparin Code Status: full Family Communication: none Disposition Plan: home    Brendia Sacks, MD  [facility] Direct contact: [phone] --Via amion app OR  --[email or web address]; password [email or web address]  7PM-7AM contact night coverage as above 09/05/2016, 11:17 AM  LOS: 1 day   Consultants:    Procedures:    Antimicrobials:  Ceftriaxone 6/9 >>  Interval history/Subjective: Feels better; no pain. Feels weak. Ate some breakfast.  Objective: Vitals: Afebrile, maximum temperature 100.2, temperature 98.8, respirations 18, pulse 81, blood pressure 145/52, SPO2 of 100% on room air  Exam:     Constitutional: appears calm and comfortable  Eyes: Pupils, irises, lids appear unremarkable  ENT: Lips and tongue appear unremarkable. Hearing grossly  Normal  CV: RRR no m/r/g. No LE  edema.  Resp: CTA bilaterally, no w/r/r. Normal respiratory effort.  Abd: soft, ntnd, no hernia noted  Skin: no rash or induration; nontender to palpation  Psych: grossly normal mood and affect; speech fluent and clear   I have personally reviewed the following:   Labs:  Basic metabolic panel unremarkable, renal function preserved  CK without significant change, 727 >> 1005 >> 886  Hemoglobin stable at 9.5, baseline  Remainder CBC unremarkable  Urinalysis normal  Imaging studies:  Chest x-ray independently reviewed, no acute disease  CT head no acute disease  Medical tests:  EKG independently reviewed sinus tachycardia, nonspecific ST changes   Test discussed with performing physician:    Decision to obtain old records:    Review and summation of old records:  Echocardiogram June 2011: LVEF 55-60 percent.  No office visits in the last 12 months  No hospitalizations in the last year  Scheduled Meds: . amitriptyline  50 mg Oral QHS  . cefUROXime  500 mg Oral BID WC  . enoxaparin (LOVENOX) injection  40 mg Subcutaneous Q24H  . metoprolol tartrate  50 mg Oral BID  . pantoprazole  40 mg Oral Daily  . sodium chloride flush  3 mL Intravenous Q12H   Continuous Infusions: . sodium chloride 100 mL/hr at 09/05/16 0604    Principal Problem:   UTI (urinary tract infection) Active Problems:   Elevated CK   Fatigue   Rhabdomyolysis   Generalized weakness   Microcytic anemia   Aortic atherosclerosis (HCC)   LOS: 1 day

## 2016-09-06 DIAGNOSIS — R5383 Other fatigue: Secondary | ICD-10-CM

## 2016-09-06 DIAGNOSIS — N3000 Acute cystitis without hematuria: Secondary | ICD-10-CM

## 2016-09-06 DIAGNOSIS — R531 Weakness: Secondary | ICD-10-CM

## 2016-09-06 LAB — BASIC METABOLIC PANEL
Anion gap: 6 (ref 5–15)
BUN: 7 mg/dL (ref 6–20)
CO2: 29 mmol/L (ref 22–32)
Calcium: 8.4 mg/dL — ABNORMAL LOW (ref 8.9–10.3)
Chloride: 103 mmol/L (ref 101–111)
Creatinine, Ser: 0.65 mg/dL (ref 0.44–1.00)
GFR calc Af Amer: >60 mL/min (ref >60)
GFR calc non Af Amer: >60 mL/min (ref >60)
Glucose, Bld: 106 mg/dL — ABNORMAL HIGH (ref 65–99)
Potassium: 3.3 mmol/L — ABNORMAL LOW (ref 3.5–5.1)
Sodium: 138 mmol/L (ref 135–145)

## 2016-09-06 LAB — CK: Total CK: 1029 U/L — ABNORMAL HIGH (ref 38–234)

## 2016-09-06 LAB — URINE CULTURE

## 2016-09-06 MED ORDER — CEFUROXIME AXETIL 500 MG PO TABS: 500.0000 mg | ORAL_TABLET | Freq: Two times a day (BID) | ORAL | 0 refills | Status: DC

## 2016-09-06 MED ORDER — POTASSIUM CHLORIDE CRYS ER 20 MEQ PO TBCR
40.0000 meq | EXTENDED_RELEASE_TABLET | Freq: Once | ORAL | Status: AC
  Administered 2016-09-06: 40 meq via ORAL
  Filled 2016-09-06: qty 2

## 2016-09-06 NOTE — Care Management Important Message (Signed)
Important Message  Patient Details  Name: Kiara [name] MRN: [id number] Date of Birth: 1940/07/29   Medicare Important Message Given:  Yes    [name], Chrystine Oiler, RN 09/06/2016, 11:33 AM

## 2016-09-06 NOTE — Progress Notes (Signed)
Patient's IV removed.  Site WNL.  AVS reviewed with patient.  Verbalized understanding of discharge instructions, physician follow-up, medications.  Patient transported by NT in via wheelchair to main entrance for discharge.  Patient stable at time of discharge.

## 2016-09-06 NOTE — Evaluation (Signed)
Physical Therapy Evaluation Patient Details Name: Kiara [name] MRN: [id number] DOB: Sep 15, 1940 Today's Date: 09/06/2016   History of Present Illness  Kiara [name] is a 76yo white female who comes to [facility] after 1 week progressive weakness, and 2D unable to stand or AMB. She reports fever 4D PTA. The patient reports sleeping/lying on the floor for the past 2D. Pt is admitted for UTI, rhabdomyolisis. PMH: FM, chronic anemia, 3 back surgeries with chronic limiting back pain, hypokalemia.  Clinical Impression  Pt admitted with above diagnosis. Pt currently with functional limitations due to the deficits listed below (see "PT Problem List"). Upon entry, the patient is received semirecumbent in bed, no family/caregiver present.The pt is awake and agreeable to participate. No acute distress noted at this time, pt only reporting some mild, nonconcerning chronic pain. Pt describes a long progressive decline in independence with IADL over the past 12-18 months, with increased fatigue during IADL, and increased role of neighbors assistance in her care.  Functional mobility assessment demonstrates mild-moderately limited tolerance to functional mobility, mostly due to BLE fatigue, rather than her typically limiting chronic LBP. The patient is able to AMB household distances with SPC, c two instances of LOB related to right hip instability and crossover gait. Pt is typically able to AMB in the community to make groceries, which requires tolerance of distances 5x greater than today's tolerance. The patient is at high risk for falls as evidence by gait speed <0.51m/s, forward reach <5", and multiple LOB demonstrated throughout session. Pt will benefit from skilled PT intervention to increase independence and safety with basic mobility in preparation for discharge to the venue listed below.       Follow Up Recommendations Outpatient PT    Equipment Recommendations  None recommended by PT    Recommendations for Other  Services       Precautions / Restrictions Precautions Precautions: None Precaution Comments: no score available  Restrictions Weight Bearing Restrictions: No      Mobility  Bed Mobility Overal bed mobility: Independent                Transfers Overall transfer level: Modified independent Equipment used: None                Ambulation/Gait Ambulation/Gait assistance: Min guard Ambulation Distance (Feet): 260 Feet Assistive device: Straight cane Gait Pattern/deviations: Trendelenburg (Rigth hip instability with LOBx2)   Gait velocity interpretation: <1.8 ft/sec, indicative of risk for recurrent falls General Gait Details: 0.51m/s   Stairs            Wheelchair Mobility    Modified Rankin (Stroke Patients Only)       Balance Overall balance assessment: Needs assistance         Standing balance support: During functional activity   Standing balance comment: 2 instances of Rt lateral hip instability resultant in increased gait crossover and LOB.                              Pertinent Vitals/Pain Pain Assessment: 0-10 Pain Score: 7  Pain Location: low back  Pain Descriptors / Indicators: Aching Pain Intervention(s): RN gave pain meds during session;Limited activity within patient's tolerance;Monitored during session    Home Living Family/patient expects to be discharged to:: Private residence Living Arrangements: Alone Available Help at Discharge: Neighbor Type of Home: Mobile home Home Access: Stairs to enter Entrance Stairs-Rails: Right Entrance Stairs-Number of Steps: 2 Home Layout: One level  Home Equipment: Gilmer Mor - single point;Walker - 2 wheels;Tub bench      Prior Function Level of Independence: Needs assistance   Gait / Transfers Assistance Needed: limited  community distances with shopping buggy or SPC  ADL's / Homemaking Assistance Needed: needs assistance with yard adn housework; she still makes groceries and  meals, but it is quite difficult and requires accomodation.         Hand Dominance        Extremity/Trunk Assessment   Upper Extremity Assessment Upper Extremity Assessment: Overall WFL for tasks assessed    Lower Extremity Assessment Lower Extremity Assessment: Overall WFL for tasks assessed       Communication   Communication: No difficulties  Cognition Arousal/Alertness: Awake/alert Behavior During Therapy: WFL for tasks assessed/performed Overall Cognitive Status: Within Functional Limits for tasks assessed                                        General Comments      Exercises     Assessment/Plan    PT Assessment Patient needs continued PT services  PT Problem List Decreased strength;Decreased range of motion;Decreased activity tolerance;Decreased balance;Decreased mobility;Pain;Obesity       PT Treatment Interventions Gait training;Functional mobility training;Therapeutic exercise;Therapeutic activities;Balance training;Patient/family education    PT Goals (Current goals can be found in the Care Plan section)  Acute Rehab PT Goals Patient Stated Goal: regain independence in self care at home.  PT Goal Formulation: With patient Time For Goal Achievement: 09/20/16 Potential to Achieve Goals: Good    Frequency Min 2X/week   Barriers to discharge Decreased caregiver support      Co-evaluation               AM-PAC PT "6 Clicks" Daily Activity  Outcome Measure Difficulty turning over in bed (including adjusting bedclothes, sheets and blankets)?: A Little Difficulty moving from lying on back to sitting on the side of the bed? : A Little Difficulty sitting down on and standing up from a chair with arms (e.g., wheelchair, bedside commode, etc,.)?: A Little Help needed moving to and from a bed to chair (including a wheelchair)?: A Little Help needed walking in hospital room?: A Little Help needed climbing 3-5 steps with a railing? : A  Lot 6 Click Score: 17    End of Session Equipment Utilized During Treatment: Gait belt Activity Tolerance: Patient tolerated treatment well;Patient limited by fatigue Patient left: in bed;with call bell/phone within reach Nurse Communication: Mobility status PT Visit Diagnosis: Muscle weakness (generalized) (M62.81);Other abnormalities of gait and mobility (R26.89);Difficulty in walking, not elsewhere classified (R26.2)    Time: 4401-0272 PT Time Calculation (min) (ACUTE ONLY): 17 min   Charges:   PT Evaluation $PT Eval Low Complexity: 1 Procedure PT Treatments $Therapeutic Activity: 8-22 mins   PT G Codes:        12:35 PM, 09-12-16 Rosamaria Lints, PT, DPT Physical Therapist - [place] [phone] (Office)    Amario Longmore C 09-12-16, 12:31 PM

## 2016-09-06 NOTE — Discharge Summary (Signed)
Physician Discharge Summary  Kiara [name] Nerio WOE:[id number] DOB: 1940-06-18 DOA: 09/04/2016  PCP: Gareth Morgan, MD  Admit date: 09/04/2016 Discharge date: 09/06/2016  Recommendations for Outpatient Follow-up:  1. Generalized weakness 2. Chronic microcytic anemia of unclear significance 3. Aortic atherosclerosis 4. Outpatient physical therapy was recommended but the patient declined   Follow-up Information    Gareth Morgan, MD On 09/21/2016.   Specialty:  Family Medicine Why:  at 12:50 pm Contact information: [address] [place] [address] [phone]            Discharge Diagnoses:  1. UTI 2. Elevated CK 3. Generalized weakness 4. Chronic microcytic anemia 5. Aortic atherosclerosis  Discharge Condition: Improved Disposition: home  Diet recommendation: Regular  Filed Weights   09/04/16 1429 09/04/16 1715  Weight: 80.3 kg (177 lb) 81.6 kg (180 lb)    History of present illness:  76 year old woman presented with one-week history of gradually increasing weakness, subjective fever, chills, inability to stand or walk for 2 days. Reported crawling on the floor and sleeping on the floor due to being too weak to get up onto the couch or bed. Unable to recall whether has had 19 either drink for the last 2 days. Admitted for UTI, rhabdomyolysis/elevated CK.  Hospital Course:  Treated with aggressive IV fluids with preservation of renal function in marked clinical improvement. Although CK remains elevated, the patient's ambulating without difficulty his no significant pain and would like to go home. Expect spontaneous resolution, not surprising to see her CPK be elevated given she spent significant amount time on the ground. She is able to drink fluids and should do well in the outpatient setting.  1: UTI with associated generalized weakness. Remains afebrile. Urine culture was unrevealing, however will treat empirically.  2: Elevated CK. No significant change,  however renal function is preserved urine output is adequate. Expect spontaneous resolution in the outpatient setting.  3: Generalized weakness. Improved with IV fluids. Patient able to ambulate in the room. Plan physical therapy evaluation prior to discharge.  4: Chronic microcytic anemia, follow-up as an outpatient.  5: Aortic atherosclerosis, asymptomatic.  Today's assessment: S: Feels much better today. No significant pain. Ambulating without difficulty. O: Vitals: Afebrile, temperature 99.1, respirations 18, pulse 77, blood pressure 148/53, SPO2 99% on room air.   Constitutional. Appears calm, comfortable.  Cardiovascular. Regular rate and rhythm. No murmur, rub or gallop. No lower extremity edema.  Respiratory. Clear to auscultation bilaterally. No wheezes, rales or rhonchi. Normal respiratory effort.  Musculoskeletal. Ambulates in the room without difficulty. Stable balance.  Discharge Instructions  Discharge Instructions    Diet - low sodium heart healthy    Complete by:  As directed    Discharge instructions    Complete by:  As directed    Call your physician or seek immediate medical attention for weakness, pain, fever, vomiting or worsening of condition.   Increase activity slowly    Complete by:  As directed      Allergies as of 09/06/2016      Reactions   Nsaids Other (See Comments)   Patient says large doses cause shortness of breath, but she can take small doses   Celebrex [celecoxib] Nausea Only, Other (See Comments)   Patient states it made her nauseated and short-of-breath; she also says it did not work well for her   Gabapentin Other (See Comments)   Had shocking sensations in legs   Sterapred [prednisone] Other (See Comments)   Patient says it affects  her negatively; side-effects patient says, have strange results on her      Medication List    TAKE these medications   ALPRAZolam 1 MG tablet Commonly known as:  XANAX Take 0.5-1 mg by mouth at  bedtime as needed for anxiety.   amitriptyline 50 MG tablet Commonly known as:  ELAVIL Take 50 mg by mouth at bedtime.   b complex vitamins tablet Take 1 tablet by mouth daily.   cefUROXime 500 MG tablet Commonly known as:  CEFTIN Take 1 tablet (500 mg total) by mouth 2 (two) times daily with a meal.   HYDROcodone-acetaminophen 10-325 MG tablet Commonly known as:  NORCO Take 1 tablet by mouth 2 (two) times daily as needed for pain.   ibuprofen 200 MG tablet Commonly known as:  ADVIL,MOTRIN Take 200 mg by mouth daily as needed.   metoprolol tartrate 50 MG tablet Commonly known as:  LOPRESSOR Take by mouth 2 (two) times daily.   MULTIVITAMIN ADULTS PO Take 1 tablet by mouth daily.   omeprazole 20 MG capsule Commonly known as:  PRILOSEC Take 20 mg by mouth daily.   sodium chloride 0.65 % Soln nasal spray Commonly known as:  OCEAN Place 1 spray into both nostrils as needed for congestion.      Allergies  Allergen Reactions  . Nsaids Other (See Comments)    Patient says large doses cause shortness of breath, but she can take small doses  . Celebrex [Celecoxib] Nausea Only and Other (See Comments)    Patient states it made her nauseated and short-of-breath; she also says it did not work well for her  . Gabapentin Other (See Comments)    Had shocking sensations in legs  . Sterapred [Prednisone] Other (See Comments)    Patient says it affects her negatively; side-effects patient says, have strange results on her    The results of significant diagnostics from this hospitalization (including imaging, microbiology, ancillary and laboratory) are listed below for reference.    Significant Diagnostic Studies: Dg Chest 2 View  Result Date: 09/04/2016 CLINICAL DATA:  EMs reports pt c/o increased generalized weakness and aching all over x 1 week. Reports urinary incontinence. Pt says has had chills intermittently. Pt says has been in the floor crawling to get around her house the  past 2 days. Non-smoker. EXAM: CHEST  2 VIEW COMPARISON:  06/22/2013 FINDINGS: Numerous leads and wires project over the chest. Midline trachea. Mild cardiomegaly. Tortuous thoracic aorta. Aortic atherosclerosis. Biapical pleural-parenchymal scarring. IMPRESSION: No acute cardiopulmonary disease. Cardiomegaly without congestive failure. Aortic atherosclerosis. Electronically Signed   By: Jeronimo Greaves M.D.   On: 09/04/2016 15:14   Ct Head Wo Contrast  Result Date: 09/04/2016 CLINICAL DATA:  Generalized weakness and general body aches for 1 week EXAM: CT HEAD WITHOUT CONTRAST TECHNIQUE: Contiguous axial images were obtained from the base of the skull through the vertex without intravenous contrast. COMPARISON:  None. FINDINGS: Brain: Global atrophy. Lacune or infarcts in the left basal ganglia. There is no mass effect, midline shift, or acute intracranial hemorrhage. Vascular: No hyperdense vessel or unexpected calcification. Skull: Cranium is intact. Sinuses/Orbits: Mastoid air cells and visualized paranasal sinuses are clear pre Other: Noncontributory. IMPRESSION: Chronic ischemic changes and atrophy. No acute intracranial pathology. Electronically Signed   By: Jolaine Click M.D.   On: 09/04/2016 15:20    Microbiology: Recent Results (from the past 240 hour(s))  Urine culture     Status: Abnormal   Collection Time: 09/04/16  2:50 PM  Result Value Ref Range Status   Specimen Description URINE, CATHETERIZED  Final   Special Requests NONE  Final   Culture MULTIPLE SPECIES PRESENT, SUGGEST RECOLLECTION (A)  Final   Report Status 09/06/2016 FINAL  Final     Labs: Basic Metabolic Panel:  Recent Labs Lab 09/04/16 1519 09/05/16 0605 09/06/16 0535  NA 134* 137 138  K 3.3* 3.7 3.3*  CL 98* 103 103  CO2 25 26 29   GLUCOSE 142* 102* 106*  BUN 13 10 7   CREATININE 0.88 0.70 0.65  CALCIUM 8.6* 8.5* 8.4*   Liver Function Tests:  Recent Labs Lab 09/04/16 1519  AST 71*  ALT 65*  ALKPHOS 62    BILITOT 0.7  PROT 7.1  ALBUMIN 3.9   CBC:  Recent Labs Lab 09/04/16 1519 09/05/16 0605  WBC 5.1 4.9  NEUTROABS 3.0  --   HGB 9.8* 9.5*  HCT 30.5* 30.0*  MCV 62.1* 62.5*  PLT 177 166   Cardiac Enzymes:  Recent Labs Lab 09/04/16 1519 09/04/16 2200 09/05/16 0605 09/06/16 0535  CKTOTAL 727* 1,005* 886* 1,029*  TROPONINI <0.03  --   --   --     Principal Problem:   UTI (urinary tract infection) Active Problems:   Elevated CK   Fatigue   Rhabdomyolysis   Generalized weakness   Microcytic anemia   Aortic atherosclerosis (HCC)   Acute cystitis   Time coordinating discharge: 35 minutes  Signed:  Brendia Sacks, MD [facility] 09/06/2016, 11:26 AM

## 2016-09-06 NOTE — Care Management Note (Addendum)
Case Management Note  Patient Details  Name: Kiara [name] MRN: [id number] Date of Birth: [birth year]  Subjective/Objective:    Adm with UTI/rhabdo. From home ind PTA. Has been weak, couldn't  call for help as she was not close enough to a phone. Family has now provided patient with a phone to have close by at all times. She usually walks with a cane while running errands. Recommended for OP PT. She states she has had PT multiple times.               Action/Plan: Patient prefers to have PCP appointment and then decide on OP PT. Will ask PCP to refer. No CM needs.   Expected Discharge Date:  09/06/16               Expected Discharge Plan:  Home/Self Care  In-House Referral:     Discharge planning Services  CM Consult  Post Acute Care Choice:    Choice offered to:  NA  DME Arranged:    DME Agency:     HH Arranged:    HH Agency:     Status of Service:  Completed, signed off  If discussed at [employer] of Stay Meetings, dates discussed:    Additional Comments:  Johnaton Sonneborn, Chrystine Oiler, RN 09/06/2016, 11:29 AM

## 2016-09-21 ENCOUNTER — Ambulatory Visit (HOSPITAL_COMMUNITY)
Admission: RE | Admit: 2016-09-21 | Discharge: 2016-09-21 | Disposition: A | Discharge status: Home / Self Care | Payer: Medicare Other | Source: Ambulatory Visit | Attending: Family Medicine | Admitting: Family Medicine

## 2016-09-21 ENCOUNTER — Other Ambulatory Visit (HOSPITAL_COMMUNITY): Payer: Self-pay | Admitting: Family Medicine

## 2016-09-21 DIAGNOSIS — I700 Atherosclerosis of aorta: Secondary | ICD-10-CM | POA: Diagnosis not present

## 2016-09-21 DIAGNOSIS — R0781 Pleurodynia: Secondary | ICD-10-CM

## 2016-09-22 ENCOUNTER — Other Ambulatory Visit (HOSPITAL_COMMUNITY): Payer: Self-pay | Admitting: Family Medicine

## 2016-09-22 ENCOUNTER — Ambulatory Visit (HOSPITAL_COMMUNITY)
Admission: RE | Admit: 2016-09-22 | Discharge: 2016-09-22 | Disposition: A | Discharge status: Home / Self Care | Payer: Medicare Other | Source: Ambulatory Visit | Attending: Family Medicine | Admitting: Family Medicine

## 2016-09-22 DIAGNOSIS — M549 Dorsalgia, unspecified: Secondary | ICD-10-CM

## 2016-09-22 DIAGNOSIS — I700 Atherosclerosis of aorta: Secondary | ICD-10-CM | POA: Insufficient documentation

## 2016-09-28 ENCOUNTER — Other Ambulatory Visit (HOSPITAL_COMMUNITY): Payer: Self-pay | Admitting: Family Medicine

## 2016-09-28 DIAGNOSIS — E1142 Type 2 diabetes mellitus with diabetic polyneuropathy: Secondary | ICD-10-CM

## 2016-09-28 DIAGNOSIS — N200 Calculus of kidney: Secondary | ICD-10-CM

## 2016-09-28 DIAGNOSIS — R52 Pain, unspecified: Secondary | ICD-10-CM

## 2016-09-28 DIAGNOSIS — M545 Low back pain: Secondary | ICD-10-CM

## 2016-09-28 DIAGNOSIS — R109 Unspecified abdominal pain: Secondary | ICD-10-CM

## 2016-09-28 DIAGNOSIS — Z87442 Personal history of urinary calculi: Secondary | ICD-10-CM

## 2016-09-28 DIAGNOSIS — I10 Essential (primary) hypertension: Secondary | ICD-10-CM

## 2016-09-28 DIAGNOSIS — Z841 Family history of disorders of kidney and ureter: Secondary | ICD-10-CM

## 2016-10-01 ENCOUNTER — Ambulatory Visit (HOSPITAL_COMMUNITY)
Admission: RE | Admit: 2016-10-01 | Discharge: 2016-10-01 | Disposition: A | Discharge status: Home / Self Care | Payer: Medicare Other | Source: Ambulatory Visit | Attending: Family Medicine | Admitting: Family Medicine

## 2016-10-01 DIAGNOSIS — Z87442 Personal history of urinary calculi: Secondary | ICD-10-CM | POA: Diagnosis present

## 2016-10-01 DIAGNOSIS — N200 Calculus of kidney: Secondary | ICD-10-CM

## 2016-10-01 DIAGNOSIS — R109 Unspecified abdominal pain: Secondary | ICD-10-CM | POA: Insufficient documentation

## 2016-10-01 DIAGNOSIS — M545 Low back pain: Secondary | ICD-10-CM | POA: Insufficient documentation

## 2017-07-29 NOTE — Patient Instructions (Signed)
Your procedure is scheduled on: 08/08/2017   Report to [facility] at  1100   AM.  Call this number if you have problems the morning of surgery: [phone]   Do not eat food or drink liquids :After Midnight.      Take these medicines the morning of surgery with A SIP OF WATER: hydrocodone, metoprolol, prilosec.   Do not wear jewelry, make-up or nail polish.  Do not wear lotions, powders, or perfumes. You may wear deodorant.  Do not shave 48 hours prior to surgery.  Do not bring valuables to the hospital.  Contacts, dentures or bridgework may not be worn into surgery.  Leave suitcase in the car. After surgery it may be brought to your room.  For patients admitted to the hospital, checkout time is 11:00 AM the day of discharge.   Patients discharged the day of surgery will not be allowed to drive home.  :     Please read over the following fact sheets that you were given: Coughing and Deep Breathing, Surgical Site Infection Prevention, Anesthesia Post-op Instructions and Care and Recovery After Surgery    Cataract A cataract is a clouding of the lens of the eye. When a lens becomes cloudy, vision is reduced based on the degree and nature of the clouding. Many cataracts reduce vision to some degree. Some cataracts make people more near-sighted as they develop. Other cataracts increase glare. Cataracts that are ignored and become worse can sometimes look white. The white color can be seen through the pupil. CAUSES   Aging. However, cataracts may occur at any age, even in newborns.   Certain drugs.   Trauma to the eye.   Certain diseases such as diabetes.   Specific eye diseases such as chronic inflammation inside the eye or a sudden attack of a rare form of glaucoma.   Inherited or acquired medical problems.  SYMPTOMS   Gradual, progressive drop in vision in the affected eye.   Severe, rapid visual loss. This most often happens when trauma is the cause.  DIAGNOSIS  To detect a  cataract, an eye doctor examines the lens. Cataracts are best diagnosed with an exam of the eyes with the pupils enlarged (dilated) by drops.  TREATMENT  For an early cataract, vision may improve by using different eyeglasses or stronger lighting. If that does not help your vision, surgery is the only effective treatment. A cataract needs to be surgically removed when vision loss interferes with your everyday activities, such as driving, reading, or watching TV. A cataract may also have to be removed if it prevents examination or treatment of another eye problem. Surgery removes the cloudy lens and usually replaces it with a substitute lens (intraocular lens, IOL).  At a time when both you and your doctor agree, the cataract will be surgically removed. If you have cataracts in both eyes, only one is usually removed at a time. This allows the operated eye to heal and be out of danger from any possible problems after surgery (such as infection or poor wound healing). In rare cases, a cataract may be doing damage to your eye. In these cases, your caregiver may advise surgical removal right away. The vast majority of people who have cataract surgery have better vision afterward. HOME CARE INSTRUCTIONS  If you are not planning surgery, you may be asked to do the following:  Use different eyeglasses.   Use stronger or brighter lighting.   Ask your eye doctor about reducing  your medicine dose or changing medicines if it is thought that a medicine caused your cataract. Changing medicines does not make the cataract go away on its own.   Become familiar with your surroundings. Poor vision can lead to injury. Avoid bumping into things on the affected side. You are at a higher risk for tripping or falling.   Exercise extreme care when driving or operating machinery.   Wear sunglasses if you are sensitive to bright light or experiencing problems with glare.  SEEK IMMEDIATE MEDICAL CARE IF:   You have a  worsening or sudden vision loss.   You notice redness, swelling, or increasing pain in the eye.   You have a fever.  Document Released: 03/15/2005 Document Revised: 03/04/2011 Document Reviewed: 11/06/2010 [name] Patient Information 2012 [place].PATIENT INSTRUCTIONS POST-ANESTHESIA  IMMEDIATELY FOLLOWING SURGERY:  Do not drive or operate machinery for the first twenty four hours after surgery.  Do not make any important decisions for twenty four hours after surgery or while taking narcotic pain medications or sedatives.  If you develop intractable nausea and vomiting or a severe headache please notify your doctor immediately.  FOLLOW-UP:  Please make an appointment with your surgeon as instructed. You do not need to follow up with anesthesia unless specifically instructed to do so.  WOUND CARE INSTRUCTIONS (if applicable):  Keep a dry clean dressing on the anesthesia/puncture wound site if there is drainage.  Once the wound has quit draining you may leave it open to air.  Generally you should leave the bandage intact for twenty four hours unless there is drainage.  If the epidural site drains for more than 36-48 hours please call the anesthesia department.  QUESTIONS?:  Please feel free to call your physician or the hospital operator if you have any questions, and they will be happy to assist you.

## 2017-08-03 ENCOUNTER — Encounter (HOSPITAL_COMMUNITY): Payer: Self-pay

## 2017-08-03 ENCOUNTER — Encounter (HOSPITAL_COMMUNITY)
Admission: RE | Admit: 2017-08-03 | Discharge: 2017-08-03 | Disposition: A | Discharge status: Home / Self Care | Payer: Medicare Other | Source: Ambulatory Visit | Attending: Ophthalmology | Admitting: Ophthalmology

## 2017-08-05 ENCOUNTER — Encounter (HOSPITAL_COMMUNITY): Payer: Self-pay

## 2017-08-05 ENCOUNTER — Encounter (HOSPITAL_COMMUNITY)
Admission: RE | Admit: 2017-08-05 | Discharge: 2017-08-05 | Disposition: A | Discharge status: Home / Self Care | Payer: Medicare Other | Source: Ambulatory Visit | Attending: Ophthalmology | Admitting: Ophthalmology

## 2017-08-05 ENCOUNTER — Other Ambulatory Visit: Payer: Self-pay

## 2017-08-05 DIAGNOSIS — H25812 Combined forms of age-related cataract, left eye: Secondary | ICD-10-CM | POA: Diagnosis not present

## 2017-08-05 DIAGNOSIS — Z79899 Other long term (current) drug therapy: Secondary | ICD-10-CM | POA: Diagnosis not present

## 2017-08-05 DIAGNOSIS — F329 Major depressive disorder, single episode, unspecified: Secondary | ICD-10-CM | POA: Diagnosis not present

## 2017-08-05 DIAGNOSIS — F419 Anxiety disorder, unspecified: Secondary | ICD-10-CM | POA: Diagnosis not present

## 2017-08-05 HISTORY — DX: Adverse effect of unspecified anesthetic, initial encounter: T41.45XA

## 2017-08-05 HISTORY — DX: Nausea with vomiting, unspecified: R11.2

## 2017-08-05 HISTORY — DX: Other complications of anesthesia, initial encounter: T88.59XA

## 2017-08-05 HISTORY — DX: Other specified postprocedural states: Z98.890

## 2017-08-05 LAB — CBC WITH DIFFERENTIAL/PLATELET
Basophils Absolute: 0.1 10*3/uL (ref 0.0–0.1)
Basophils Relative: 1 %
Eosinophils Absolute: 0.1 10*3/uL (ref 0.0–0.7)
Eosinophils Relative: 2 %
HCT: 33 % — ABNORMAL LOW (ref 36.0–46.0)
Hemoglobin: 10.1 g/dL — ABNORMAL LOW (ref 12.0–15.0)
Lymphocytes Relative: 24 %
Lymphs Abs: 1.2 10*3/uL (ref 0.7–4.0)
MCH: 19.7 pg — ABNORMAL LOW (ref 26.0–34.0)
MCHC: 30.6 g/dL (ref 30.0–36.0)
MCV: 64.3 fL — ABNORMAL LOW (ref 78.0–100.0)
Monocytes Absolute: 0.5 10*3/uL (ref 0.1–1.0)
Monocytes Relative: 9 %
Neutro Abs: 3.2 10*3/uL (ref 1.7–7.7)
Neutrophils Relative %: 64 %
Platelets: 305 10*3/uL (ref 150–400)
RBC: 5.13 MIL/uL — ABNORMAL HIGH (ref 3.87–5.11)
RDW: 15.9 % — ABNORMAL HIGH (ref 11.5–15.5)
WBC: 5.1 10*3/uL (ref 4.0–10.5)

## 2017-08-05 LAB — BASIC METABOLIC PANEL
Anion gap: 9 (ref 5–15)
BUN: 14 mg/dL (ref 6–20)
CO2: 29 mmol/L (ref 22–32)
Calcium: 9.4 mg/dL (ref 8.9–10.3)
Chloride: 98 mmol/L — ABNORMAL LOW (ref 101–111)
Creatinine, Ser: 0.81 mg/dL (ref 0.44–1.00)
GFR calc Af Amer: >60 mL/min (ref >60)
GFR calc non Af Amer: >60 mL/min (ref >60)
Glucose, Bld: 106 mg/dL — ABNORMAL HIGH (ref 65–99)
Potassium: 4.6 mmol/L (ref 3.5–5.1)
Sodium: 136 mmol/L (ref 135–145)

## 2017-08-08 ENCOUNTER — Encounter (HOSPITAL_COMMUNITY)
Admission: RE | Disposition: A | Discharge status: Home / Self Care | Payer: Self-pay | Source: Ambulatory Visit | Attending: Ophthalmology

## 2017-08-08 ENCOUNTER — Ambulatory Visit (HOSPITAL_COMMUNITY): Payer: Medicare Other | Admitting: Anesthesiology

## 2017-08-08 ENCOUNTER — Ambulatory Visit (HOSPITAL_COMMUNITY)
Admission: RE | Admit: 2017-08-08 | Discharge: 2017-08-08 | Disposition: A | Discharge status: Home / Self Care | Payer: Medicare Other | Source: Ambulatory Visit | Attending: Ophthalmology | Admitting: Ophthalmology

## 2017-08-08 ENCOUNTER — Encounter (HOSPITAL_COMMUNITY): Payer: Self-pay | Admitting: *Deleted

## 2017-08-08 DIAGNOSIS — H25812 Combined forms of age-related cataract, left eye: Secondary | ICD-10-CM | POA: Insufficient documentation

## 2017-08-08 DIAGNOSIS — Z79899 Other long term (current) drug therapy: Secondary | ICD-10-CM | POA: Diagnosis not present

## 2017-08-08 DIAGNOSIS — F329 Major depressive disorder, single episode, unspecified: Secondary | ICD-10-CM | POA: Diagnosis not present

## 2017-08-08 DIAGNOSIS — F419 Anxiety disorder, unspecified: Secondary | ICD-10-CM | POA: Diagnosis not present

## 2017-08-08 HISTORY — PX: CATARACT EXTRACTION W/PHACO: SHX586

## 2017-08-08 LAB — GLUCOSE, CAPILLARY: Glucose-Capillary: 124 mg/dL — ABNORMAL HIGH (ref 65–99)

## 2017-08-08 SURGERY — PHACOEMULSIFICATION, CATARACT, WITH IOL INSERTION
Anesthesia: Monitor Anesthesia Care | Site: Eye | Laterality: Left

## 2017-08-08 MED ORDER — NEOMYCIN-POLYMYXIN-DEXAMETH 3.5-10000-0.1 OP SUSP
OPHTHALMIC | Status: DC | PRN
  Administered 2017-08-08: 2 [drp] via OPHTHALMIC

## 2017-08-08 MED ORDER — TETRACAINE HCL 0.5 % OP SOLN
1.0000 [drp] | OPHTHALMIC | Status: AC
  Administered 2017-08-08 (×3): 1 [drp] via OPHTHALMIC

## 2017-08-08 MED ORDER — EPINEPHRINE PF 1 MG/ML IJ SOLN
INTRAMUSCULAR | Status: AC
  Filled 2017-08-08: qty 1

## 2017-08-08 MED ORDER — BSS IO SOLN
INTRAOCULAR | Status: DC | PRN
Start: 2017-08-08 — End: 2017-08-08
  Administered 2017-08-08: 15 mL

## 2017-08-08 MED ORDER — PHENYLEPHRINE HCL 2.5 % OP SOLN
1.0000 [drp] | OPHTHALMIC | Status: AC
  Administered 2017-08-08 (×3): 1 [drp] via OPHTHALMIC

## 2017-08-08 MED ORDER — EPINEPHRINE PF 1 MG/ML IJ SOLN
INTRAOCULAR | Status: DC | PRN
  Administered 2017-08-08: 500 mL

## 2017-08-08 MED ORDER — MIDAZOLAM HCL 2 MG/2ML IJ SOLN
INTRAMUSCULAR | Status: AC
  Filled 2017-08-08: qty 2

## 2017-08-08 MED ORDER — MIDAZOLAM HCL 2 MG/2ML IJ SOLN
INTRAMUSCULAR | Status: DC | PRN
  Administered 2017-08-08: 0.5 mg via INTRAVENOUS

## 2017-08-08 MED ORDER — CYCLOPENTOLATE-PHENYLEPHRINE 0.2-1 % OP SOLN
1.0000 [drp] | OPHTHALMIC | Status: AC
  Administered 2017-08-08 (×3): 1 [drp] via OPHTHALMIC

## 2017-08-08 MED ORDER — POVIDONE-IODINE 5 % OP SOLN
OPHTHALMIC | Status: DC | PRN
  Administered 2017-08-08: 1 via OPHTHALMIC

## 2017-08-08 MED ORDER — NA HYALUR & NA CHOND-NA HYALUR 0.55-0.5 ML IO KIT
PACK | INTRAOCULAR | Status: DC | PRN
  Administered 2017-08-08: 1 via OPHTHALMIC

## 2017-08-08 MED ORDER — LIDOCAINE HCL (PF) 1 % IJ SOLN
INTRAMUSCULAR | Status: DC | PRN
  Administered 2017-08-08: .7 mL via OPHTHALMIC

## 2017-08-08 MED ORDER — LIDOCAINE HCL 3.5 % OP GEL
1.0000 "application " | Freq: Once | OPHTHALMIC | Status: AC
  Administered 2017-08-08: 1 via OPHTHALMIC

## 2017-08-08 MED ORDER — LACTATED RINGERS IV SOLN
INTRAVENOUS | Status: DC
  Administered 2017-08-08: 09:00:00 via INTRAVENOUS

## 2017-08-08 SURGICAL SUPPLY — 10 items

## 2017-08-08 NOTE — Anesthesia Preprocedure Evaluation (Signed)
Anesthesia Evaluation  Patient identified by MRN, date of birth, ID band Patient awake    Reviewed: Allergy & Precautions, H&P , NPO status , Patient's Chart, lab work & pertinent test results  History of Anesthesia Complications (+) history of anesthetic complications  Airway Mallampati: II  TM Distance: >3 FB Neck ROM: full    Dental no notable dental hx.    Pulmonary neg pulmonary ROS,    Pulmonary exam normal breath sounds clear to auscultation       Cardiovascular Exercise Tolerance: Good + Peripheral Vascular Disease  negative cardio ROS   Rhythm:regular Rate:Normal     Neuro/Psych Anxiety Depression  Neuromuscular disease negative neurological ROS  negative psych ROS   GI/Hepatic negative GI ROS, Neg liver ROS,   Endo/Other  negative endocrine ROS  Renal/GU negative Renal ROS  negative genitourinary   Musculoskeletal   Abdominal   Peds  Hematology negative hematology ROS (+) anemia ,   Anesthesia Other Findings   Reproductive/Obstetrics negative OB ROS                             Anesthesia Physical Anesthesia Plan  ASA: III  Anesthesia Plan: MAC   Post-op Pain Management:    Induction:   PONV Risk Score and Plan:   Airway Management Planned:   Additional Equipment:   Intra-op Plan:   Post-operative Plan:   Informed Consent: I have reviewed the patients History and Physical, chart, labs and discussed the procedure including the risks, benefits and alternatives for the proposed anesthesia with the patient or authorized representative who has indicated his/her understanding and acceptance.   Dental Advisory Given  Plan Discussed with: CRNA  Anesthesia Plan Comments:         Anesthesia Quick Evaluation

## 2017-08-08 NOTE — Anesthesia Procedure Notes (Signed)
Procedure Name: MAC Date/Time: 08/08/2017 9:46 AM Performed by: Vista Deck, CRNA Pre-anesthesia Checklist: Patient identified, Emergency Drugs available, Suction available, Timeout performed and Patient being monitored Patient Re-evaluated:Patient Re-evaluated prior to induction Oxygen Delivery Method: Nasal Cannula

## 2017-08-08 NOTE — Discharge Instructions (Signed)

## 2017-08-08 NOTE — Op Note (Signed)
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  Assistants: None  Anesthesia: Topical with MAC  Indications: Painless, progressive loss of vision with compromise of daily activities.  Surgery: Cataract Extraction with Intraocular lens Implant Left Eye  Discription: The patient had dilating drops and viscous lidocaine placed into the Left eye in the pre-op holding area. After transfer to the operating room, a time out was performed. The patient was then prepped and draped. Beginning with a 46m blade a paracentesis port was made at the surgeon's 2 o'clock position. The anterior chamber was then filled with 1% non-preserved lidocaine with epinepherine. This was followed by filling the anterior chamber with Viscoat.  A 2.439mkeratome blade was used to make a clear corneal incision at the temporal limbus.  A bent cystatome needle was used to create a continuous tear capsulotomy. Hydrodissection was performed with balanced salt solution on a Fine canula. The lens nucleus was then removed using the phacoemulsification handpiece. Residual cortex was removed with the I&A handpiece. The anterior chamber and capsular bag were refilled with Provisc. A posterior chamber intraocular lens was placed into the capsular bag with it's injector. The implant was positioned with the Kuglan hook. The Provisc was then removed from the anterior chamber and capsular bag with the I&A handpiece. Stromal hydration of the main incision and paracentesis port was performed with BSS on a Fine canula. The wounds were tested for leak which was negative. The patient tolerated the procedure well. There were no operative complications. The patient was then transferred to the recovery room in stable condition.  Complications: None  Specimen: None  EBL: None  Prosthetic device: J&J Technis, PCB00,  power 20.0, SN [id number]

## 2017-08-08 NOTE — Transfer of Care (Signed)
Immediate Anesthesia Transfer of Care Note  Patient: Kiara [name]  Procedure(s) Performed: CATARACT EXTRACTION PHACO AND INTRAOCULAR LENS PLACEMENT LEFT EYE (Left Eye)  Patient Location: Short Stay  Anesthesia Type:MAC  Level of Consciousness: awake, alert  and patient cooperative  Airway & Oxygen Therapy: Patient Spontanous Breathing  Post-op Assessment: Report given to RN and Post -op Vital signs reviewed and stable  Post vital signs: Reviewed and stable  Last Vitals:  Vitals Value Taken Time  BP    Temp    Pulse    Resp    SpO2      Last Pain:  Vitals:   08/08/17 0854  TempSrc: Oral  PainSc: 0-No pain      Patients Stated Pain Goal: 5 (93/11/21 6244)  Complications: No apparent anesthesia complications

## 2017-08-08 NOTE — H&P (Signed)
I have reviewed the H&P, the patient was re-examined, and I have identified no interval changes in medical condition and plan of care since the history and physical of record  

## 2017-08-08 NOTE — Anesthesia Postprocedure Evaluation (Signed)
Anesthesia Post Note  Patient: Kiara [name]  Procedure(s) Performed: CATARACT EXTRACTION PHACO AND INTRAOCULAR LENS PLACEMENT LEFT EYE (Left Eye)  Patient location during evaluation: Short Stay Anesthesia Type: MAC Level of consciousness: awake and alert and patient cooperative Pain management: satisfactory to patient Respiratory status: spontaneous breathing Cardiovascular status: stable Postop Assessment: no apparent nausea or vomiting Anesthetic complications: no     Last Vitals:  Vitals:   08/08/17 0940 08/08/17 1006  BP:  (!) 125/56  Pulse:  65  Resp: (!) 36 18  Temp:  37.1 C  SpO2: 93% 96%    Last Pain:  Vitals:   08/08/17 1006  TempSrc: Oral  PainSc: 0-No pain                 Zade Falkner

## 2017-08-09 ENCOUNTER — Encounter (HOSPITAL_COMMUNITY): Payer: Self-pay | Admitting: Ophthalmology

## 2017-09-01 ENCOUNTER — Encounter (HOSPITAL_COMMUNITY)
Admission: RE | Admit: 2017-09-01 | Discharge: 2017-09-01 | Disposition: A | Discharge status: Home / Self Care | Payer: Medicare Other | Source: Ambulatory Visit | Attending: Ophthalmology | Admitting: Ophthalmology

## 2017-09-02 NOTE — Pre-Procedure Instructions (Signed)
I have attempted to contact patient multiple times to review her time with her for surgery. When I call [phone], which is the number listed for her, it rings one time and goes to a fast busy. I also have called her daughter, Hulan Amato at [phone] and left her 2 messages for her or the patient to call us back in order to give her the instructions.

## 2017-09-05 ENCOUNTER — Encounter (HOSPITAL_COMMUNITY)
Admission: RE | Disposition: A | Discharge status: Home / Self Care | Payer: Self-pay | Source: Ambulatory Visit | Attending: Ophthalmology

## 2017-09-05 ENCOUNTER — Encounter (HOSPITAL_COMMUNITY): Payer: Self-pay | Admitting: Emergency Medicine

## 2017-09-05 ENCOUNTER — Ambulatory Visit (HOSPITAL_COMMUNITY): Payer: Medicare Other | Admitting: Anesthesiology

## 2017-09-05 ENCOUNTER — Ambulatory Visit (HOSPITAL_COMMUNITY)
Admission: RE | Admit: 2017-09-05 | Discharge: 2017-09-05 | Disposition: A | Discharge status: Home / Self Care | Payer: Medicare Other | Source: Ambulatory Visit | Attending: Ophthalmology | Admitting: Ophthalmology

## 2017-09-05 ENCOUNTER — Other Ambulatory Visit: Payer: Self-pay

## 2017-09-05 DIAGNOSIS — Z9842 Cataract extraction status, left eye: Secondary | ICD-10-CM | POA: Insufficient documentation

## 2017-09-05 DIAGNOSIS — M797 Fibromyalgia: Secondary | ICD-10-CM | POA: Diagnosis not present

## 2017-09-05 DIAGNOSIS — E669 Obesity, unspecified: Secondary | ICD-10-CM | POA: Insufficient documentation

## 2017-09-05 DIAGNOSIS — Z79899 Other long term (current) drug therapy: Secondary | ICD-10-CM | POA: Diagnosis not present

## 2017-09-05 DIAGNOSIS — I10 Essential (primary) hypertension: Secondary | ICD-10-CM | POA: Diagnosis not present

## 2017-09-05 DIAGNOSIS — R7303 Prediabetes: Secondary | ICD-10-CM | POA: Diagnosis not present

## 2017-09-05 DIAGNOSIS — H25811 Combined forms of age-related cataract, right eye: Secondary | ICD-10-CM | POA: Insufficient documentation

## 2017-09-05 DIAGNOSIS — Z961 Presence of intraocular lens: Secondary | ICD-10-CM | POA: Insufficient documentation

## 2017-09-05 HISTORY — DX: Prediabetes: R73.03

## 2017-09-05 HISTORY — PX: CATARACT EXTRACTION W/PHACO: SHX586

## 2017-09-05 SURGERY — PHACOEMULSIFICATION, CATARACT, WITH IOL INSERTION
Anesthesia: Monitor Anesthesia Care | Site: Eye | Laterality: Right

## 2017-09-05 MED ORDER — CYCLOPENTOLATE-PHENYLEPHRINE 0.2-1 % OP SOLN: 1.0000 [drp] | OPHTHALMIC | Status: DC

## 2017-09-05 MED ORDER — PROVISC 10 MG/ML IO SOLN
INTRAOCULAR | Status: DC | PRN
  Administered 2017-09-05: 0.85 mL via INTRAOCULAR

## 2017-09-05 MED ORDER — LACTATED RINGERS IV SOLN
INTRAVENOUS | Status: DC
  Administered 2017-09-05: 10:00:00 via INTRAVENOUS

## 2017-09-05 MED ORDER — BSS IO SOLN
INTRAOCULAR | Status: DC | PRN
  Administered 2017-09-05: 15 mL

## 2017-09-05 MED ORDER — TETRACAINE HCL 0.5 % OP SOLN: 1.0000 [drp] | OPHTHALMIC | Status: DC

## 2017-09-05 MED ORDER — PHENYLEPHRINE HCL 2.5 % OP SOLN: 1.0000 [drp] | OPHTHALMIC | Status: DC

## 2017-09-05 MED ORDER — LIDOCAINE HCL (PF) 1 % IJ SOLN
INTRAMUSCULAR | Status: DC | PRN
  Administered 2017-09-05: .6 mL

## 2017-09-05 MED ORDER — LIDOCAINE HCL 3.5 % OP GEL: 1.0000 "application " | Freq: Once | OPHTHALMIC | Status: DC

## 2017-09-05 MED ORDER — EPINEPHRINE PF 1 MG/ML IJ SOLN
INTRAOCULAR | Status: DC | PRN
  Administered 2017-09-05: 500 mL

## 2017-09-05 MED ORDER — TETRACAINE HCL 0.5 % OP SOLN
1.0000 [drp] | OPHTHALMIC | Status: AC
  Administered 2017-09-05 (×3): 1 [drp] via OPHTHALMIC

## 2017-09-05 MED ORDER — LIDOCAINE HCL 3.5 % OP GEL
1.0000 "application " | Freq: Once | OPHTHALMIC | Status: AC
  Administered 2017-09-05: 1 via OPHTHALMIC

## 2017-09-05 MED ORDER — PHENYLEPHRINE HCL 2.5 % OP SOLN
1.0000 [drp] | OPHTHALMIC | Status: AC
  Administered 2017-09-05 (×3): 1 [drp] via OPHTHALMIC

## 2017-09-05 MED ORDER — POVIDONE-IODINE 5 % OP SOLN
OPHTHALMIC | Status: DC | PRN
  Administered 2017-09-05: 1 via OPHTHALMIC

## 2017-09-05 MED ORDER — CYCLOPENTOLATE-PHENYLEPHRINE 0.2-1 % OP SOLN
1.0000 [drp] | OPHTHALMIC | Status: AC
  Administered 2017-09-05 (×3): 1 [drp] via OPHTHALMIC

## 2017-09-05 MED ORDER — NEOMYCIN-POLYMYXIN-DEXAMETH 3.5-10000-0.1 OP SUSP
OPHTHALMIC | Status: DC | PRN
  Administered 2017-09-05: 2 [drp] via OPHTHALMIC

## 2017-09-05 SURGICAL SUPPLY — 12 items
CLOTH BEACON ORANGE TIMEOUT ST (SAFETY) ×2 IMPLANT
EYE SHIELD UNIVERSAL CLEAR (GAUZE/BANDAGES/DRESSINGS) ×2 IMPLANT
GLOVE BIOGEL PI IND STRL 6.5 (GLOVE) IMPLANT
GLOVE BIOGEL PI IND STRL 7.0 (GLOVE) IMPLANT
GLOVE BIOGEL PI INDICATOR 6.5 (GLOVE) ×4
GLOVE BIOGEL PI INDICATOR 7.0 (GLOVE) ×2
LENS ALC ACRYL/TECN (Ophthalmic Related) ×2 IMPLANT
PAD ARMBOARD 7.5X6 YLW CONV (MISCELLANEOUS) ×2 IMPLANT
SYRINGE LUER LOK 1CC (MISCELLANEOUS) ×2 IMPLANT
TAPE SURG TRANSPORE 1 IN (GAUZE/BANDAGES/DRESSINGS) IMPLANT
TAPE SURGICAL TRANSPORE 1 IN (GAUZE/BANDAGES/DRESSINGS) ×2
WATER STERILE IRR 250ML POUR (IV SOLUTION) ×2 IMPLANT

## 2017-09-05 NOTE — Anesthesia Postprocedure Evaluation (Signed)
Anesthesia Post Note  Patient: [name]  Procedure(s) Performed: CATARACT EXTRACTION PHACO AND INTRAOCULAR LENS PLACEMENT RIGHT EYE (Right Eye)  Patient location during evaluation: Short Stay Anesthesia Type: MAC Level of consciousness: awake and alert and patient cooperative Pain management: satisfactory to patient Vital Signs Assessment: post-procedure vital signs reviewed and stable Respiratory status: spontaneous breathing Cardiovascular status: stable Postop Assessment: no apparent nausea or vomiting Anesthetic complications: no     Last Vitals:  Vitals:   09/05/17 0808 09/05/17 1006  BP: (!) 120/52 (!) 119/99  Pulse: 64 67  Resp: 18 16  Temp: 36.6 C 36.4 C  SpO2: 96% 96%    Last Pain:  Vitals:   09/05/17 1006  TempSrc: Oral  PainSc: 0-No pain                 Floreen Teegarden

## 2017-09-05 NOTE — Op Note (Signed)
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  Assistants: None  Anesthesia: Topical with MAC  Indications: Painless, progressive loss of vision with compromise of daily activities.  Surgery: Cataract Extraction with Intraocular lens Implant Right Eye  Discription: The patient had dilating drops and viscous lidocaine placed into the Right eye in the pre-op holding area. After transfer to the operating room, a time out was performed. The patient was then prepped and draped. Beginning with a 62m blade a paracentesis port was made at the surgeon's 2 o'clock position. The anterior chamber was then filled with 1% non-preserved lidocaine. This was followed by filling the anterior chamber with Provisc.  A 2.463mkeratome blade was used to make a clear corneal incision at the temporal limbus.  A bent cystatome needle was used to create a continuous tear capsulotomy. Hydrodissection was performed with balanced salt solution on a Fine canula. The lens nucleus was then removed using the phacoemulsification handpiece. Residual cortex was removed with the I&A handpiece. The anterior chamber and capsular bag were refilled with Provisc. A posterior chamber intraocular lens was placed into the capsular bag with it's injector. The implant was positioned with the Kuglan hook. The Provisc was then removed from the anterior chamber and capsular bag with the I&A handpiece. Stromal hydration of the main incision and paracentesis port was performed with BSS on a Fine canula. The wounds were tested for leak which was negative. The patient tolerated the procedure well. There were no operative complications. The patient was then transferred to the recovery room in stable condition.  Complications: None  Specimen: None  EBL: None  Prosthetic device: J&J Technis, PCB00, power 20.5,  SN [id number]

## 2017-09-05 NOTE — H&P (Signed)
I have reviewed the H&P, the patient was re-examined, and I have identified no interval changes in medical condition and plan of care since the history and physical of record  

## 2017-09-05 NOTE — Transfer of Care (Signed)
Immediate Anesthesia Transfer of Care Note  Patient: Kiara [name]  Procedure(s) Performed: CATARACT EXTRACTION PHACO AND INTRAOCULAR LENS PLACEMENT RIGHT EYE (Right Eye)  Patient Location: Short Stay  Anesthesia Type:MAC  Level of Consciousness: awake, alert  and patient cooperative  Airway & Oxygen Therapy: Patient Spontanous Breathing  Post-op Assessment: Report given to RN and Post -op Vital signs reviewed and stable  Post vital signs: Reviewed and stable  Last Vitals:  Vitals Value Taken Time  BP    Temp    Pulse    Resp    SpO2      Last Pain:  Vitals:   09/05/17 0808  TempSrc: Oral  PainSc: 0-No pain         Complications: No apparent anesthesia complications

## 2017-09-05 NOTE — Discharge Instructions (Signed)

## 2017-09-05 NOTE — Anesthesia Procedure Notes (Signed)
Procedure Name: MAC Date/Time: 09/05/2017 9:48 AM Performed by: Vista Deck, CRNA Pre-anesthesia Checklist: Patient identified, Emergency Drugs available, Suction available, Timeout performed and Patient being monitored Patient Re-evaluated:Patient Re-evaluated prior to induction Oxygen Delivery Method: Nasal Cannula

## 2017-09-05 NOTE — Anesthesia Preprocedure Evaluation (Signed)
Anesthesia Evaluation  Patient identified by MRN, date of birth, ID band Patient awake    Reviewed: Allergy & Precautions, H&P , NPO status , Patient's Chart, lab work & pertinent test results, reviewed documented beta blocker date and time   History of Anesthesia Complications (+) PONV and history of anesthetic complications  Airway Mallampati: II  TM Distance: >3 FB Neck ROM: full    Dental no notable dental hx. (+) Lower Dentures, Upper Dentures   Pulmonary neg pulmonary ROS,    Pulmonary exam normal breath sounds clear to auscultation       Cardiovascular Exercise Tolerance: Good hypertension, On Medications negative cardio ROS   Rhythm:regular Rate:Normal     Neuro/Psych Anxiety Depression  Neuromuscular disease negative neurological ROS  negative psych ROS   GI/Hepatic negative GI ROS, Neg liver ROS,   Endo/Other  negative endocrine ROS  Renal/GU negative Renal ROS  negative genitourinary   Musculoskeletal   Abdominal   Peds  Hematology negative hematology ROS (+) anemia ,   Anesthesia Other Findings H/O PONV to Anesthestic Gas No c/o PONV following recent MAC for IOL/PHACO No interval changes to health since Phaco/IOL in May H/o fibromyalgia, obesity  Reproductive/Obstetrics negative OB ROS                             Anesthesia Physical Anesthesia Plan  ASA: III  Anesthesia Plan: MAC   Post-op Pain Management:    Induction:   PONV Risk Score and Plan:   Airway Management Planned:   Additional Equipment:   Intra-op Plan:   Post-operative Plan:   Informed Consent: I have reviewed the patients History and Physical, chart, labs and discussed the procedure including the risks, benefits and alternatives for the proposed anesthesia with the patient or authorized representative who has indicated his/her understanding and acceptance.   Dental Advisory Given  Plan  Discussed with: CRNA and Anesthesiologist  Anesthesia Plan Comments:         Anesthesia Quick Evaluation

## 2017-09-06 ENCOUNTER — Encounter (HOSPITAL_COMMUNITY): Payer: Self-pay | Admitting: Ophthalmology

## 2017-11-07 ENCOUNTER — Ambulatory Visit (HOSPITAL_COMMUNITY)
Admission: RE | Admit: 2017-11-07 | Discharge: 2017-11-07 | Disposition: A | Discharge status: Home / Self Care | Payer: Medicare Other | Source: Ambulatory Visit | Attending: Family Medicine | Admitting: Family Medicine

## 2017-11-07 ENCOUNTER — Other Ambulatory Visit (HOSPITAL_COMMUNITY): Payer: Self-pay | Admitting: Family Medicine

## 2017-11-07 DIAGNOSIS — M545 Low back pain: Secondary | ICD-10-CM

## 2017-11-07 DIAGNOSIS — M8588 Other specified disorders of bone density and structure, other site: Secondary | ICD-10-CM | POA: Insufficient documentation

## 2017-11-07 DIAGNOSIS — M549 Dorsalgia, unspecified: Secondary | ICD-10-CM

## 2017-11-07 DIAGNOSIS — M5136 Other intervertebral disc degeneration, lumbar region: Secondary | ICD-10-CM | POA: Diagnosis not present

## 2017-11-07 DIAGNOSIS — M5134 Other intervertebral disc degeneration, thoracic region: Secondary | ICD-10-CM | POA: Insufficient documentation

## 2017-11-16 ENCOUNTER — Other Ambulatory Visit (HOSPITAL_COMMUNITY): Payer: Self-pay | Admitting: Nurse Practitioner

## 2017-11-16 DIAGNOSIS — Z780 Asymptomatic menopausal state: Secondary | ICD-10-CM

## 2017-12-05 ENCOUNTER — Inpatient Hospital Stay (HOSPITAL_COMMUNITY)
Admission: RE | Admit: 2017-12-05 | Discharge: 2017-12-05 | Disposition: A | Discharge status: Home / Self Care | Payer: Medicare Other | Source: Ambulatory Visit | Attending: Nurse Practitioner | Admitting: Nurse Practitioner

## 2017-12-05 ENCOUNTER — Ambulatory Visit (HOSPITAL_COMMUNITY)
Admission: RE | Admit: 2017-12-05 | Discharge: 2017-12-05 | Disposition: A | Discharge status: Home / Self Care | Payer: Medicare Other | Source: Ambulatory Visit | Attending: Nurse Practitioner | Admitting: Nurse Practitioner

## 2017-12-05 DIAGNOSIS — Z780 Asymptomatic menopausal state: Secondary | ICD-10-CM | POA: Diagnosis not present

## 2017-12-05 DIAGNOSIS — M85832 Other specified disorders of bone density and structure, left forearm: Secondary | ICD-10-CM | POA: Diagnosis not present

## 2017-12-05 DIAGNOSIS — M85852 Other specified disorders of bone density and structure, left thigh: Secondary | ICD-10-CM | POA: Insufficient documentation

## 2017-12-12 ENCOUNTER — Ambulatory Visit (INDEPENDENT_AMBULATORY_CARE_PROVIDER_SITE_OTHER): Payer: Medicare Other

## 2017-12-12 ENCOUNTER — Ambulatory Visit (INDEPENDENT_AMBULATORY_CARE_PROVIDER_SITE_OTHER): Payer: Medicare Other | Admitting: Orthopedic Surgery

## 2017-12-12 VITALS — BP 129/65 | HR 66 | Ht 63.0 in | Wt 175.0 lb

## 2017-12-12 DIAGNOSIS — M7542 Impingement syndrome of left shoulder: Secondary | ICD-10-CM | POA: Diagnosis not present

## 2017-12-12 DIAGNOSIS — M25512 Pain in left shoulder: Secondary | ICD-10-CM

## 2017-12-12 NOTE — Progress Notes (Signed)
Patient ID: Kiara [name], female   DOB: 08-28-1940, 77 y.o.   MRN: [id number]  Chief Complaint  Patient presents with  . Follow-up    Recheck on left shoulder.    HPI Kiara [name] is a 77 y.o. female.  Presents for reevaluation of her left shoulder history of left shoulder impingement syndrome previously treated with exercise and injection had an MRI did not show a rotator cuff tear.  She comes in complaining of anterior lateral left shoulder pain of several weeks duration moderate in severity dull in quality associated with painful range of motion  Review of Systems ROS   Neuro tingling negative No skin rash No fever  Past Medical History:  Diagnosis Date  . AC (acromioclavicular) joint bone spurs   . Arthritis   . Chronic fatigue   . Complication of anesthesia   . Fibromyalgia   . PONV (postoperative nausea and vomiting)   . Pre-diabetes   . Rheumatoid arthritis(714.0)   . Sciatica   . 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 ABDOMINAL HYSTERECTOMY    . ANUS SURGERY    . BACK SURGERY     3 back surgeries  . CATARACT EXTRACTION W/PHACO Left 08/08/2017   Procedure: CATARACT EXTRACTION PHACO AND INTRAOCULAR LENS PLACEMENT LEFT EYE;  Surgeon: Gemma Payor, MD;  Location: [facility];  Service: Ophthalmology;  Laterality: Left;  CDE: 19.49  . CATARACT EXTRACTION W/PHACO Right 09/05/2017   Procedure: CATARACT EXTRACTION PHACO AND INTRAOCULAR LENS PLACEMENT RIGHT EYE;  Surgeon: Gemma Payor, MD;  Location: [facility];  Service: Ophthalmology;  Laterality: Right;  CDE: 11.76  . CHOLECYSTECTOMY    . left knee    . TONSILLECTOMY    . TUBAL LIGATION      Family History  Problem Relation Age of Onset  . Heart disease Unknown   . Arthritis Unknown   . Diabetes Unknown      Social History   Tobacco Use  . Smoking status: Never Smoker  . Smokeless tobacco: Never Used  Substance Use Topics  . Alcohol use: No  . Drug use: No    Allergies   Allergen Reactions  . Nsaids Other (See Comments)    Patient says large doses cause shortness of breath, but she can take small doses  . Celebrex [Celecoxib] Nausea Only and Other (See Comments)    Patient states it made her nauseated and short-of-breath; she also says it did not work well for her  . Gabapentin Other (See Comments)    Had shocking sensations in legs  . Sterapred [Prednisone] Other (See Comments)    Patient says it affects her negatively; side-effects patient says, have strange results on her       No outpatient medications have been marked as taking for the 12/12/17 encounter (Office Visit) with Vickki Hearing, MD.       Physical Exam BP 129/65   Pulse 66   Ht 5\' 3"  (1.6 m)   Wt 175 lb (79.4 kg)   BMI 31.00 kg/m  Physical Exam  Constitutional: She is oriented to person, place, and time. She appears well-developed and well-nourished.  Neurological: She is alert and oriented to person, place, and time.  Psychiatric: She has a normal mood and affect. Judgment normal.  Vitals reviewed.  Ambulatory status normal with no assistive devices Ortho Exam   Right shoulder no tenderness or swelling Range of motion is full Abduction external rotation no instability  sulcus test negative Muscle tone strength normal in the rotator cuff Skin no lesions in the right shoulder or arm Supraclavicular lymph nodes are negative Sensations intact Pulses good and strong and the radial artery   Left shoulder anterolateral tenderness without swelling Flexion arc 120 degrees active 150 degrees passive She has stable in abduction external rotation No gross motor weakness in the rotator cuff Skin is warm and dry Strong radial pulse Sensation is normal Drop arm test is negative Painful arc of motion is 90 to 150 degrees Empty cans test is negative External rotation lag test is normal Liftoff test is normal  PROVOCATIVE TESTS DROP ARM TEST PAINFUL ARC EMPTY CAN  -JOBST TEST  EXTERNAL ROTATION LAG TEST  LIFT OFF TEST BELLYPRESS TEST   MEDICAL DECISION SECTION  xrays ordered? YES  My independent reading of xrays: 2 views left shoulder other than osteopenia glenohumeral joint looks normal to the acromion is a type I is no fracture please see dictated report   Encounter Diagnoses  Name Primary?  . Pain in joint of left shoulder Yes  . Shoulder impingement, left      PLAN:   No orders of the defined types were placed in this encounter.  Injection? YES  Procedure note the subacromial injection shoulder left   Verbal consent was obtained to inject the  Left   Shoulder  Timeout was completed to confirm the injection site is a subacromial space of the  left  shoulder  Medication used Depo-Medrol 40 mg and lidocaine 1% 3 cc  Anesthesia was provided by ethyl chloride  The injection was performed in the left  posterior subacromial space. After pinning the skin with alcohol and anesthetized the skin with ethyl chloride the subacromial space was injected using a 20-gauge needle. There were no complications  Sterile dressing was applied. MRI/CT/?  Done previously did not show rotator cuff tear back in 2017

## 2018-03-11 IMAGING — DX DG RIBS 2V*L*
2 series · 2 of 2 positions shown · non-contrast
Comparison: Chest x-ray of September 04, 2016

CLINICAL DATA: Left-sided rib cage pain for the past 2 weeks after
sitting down to heart.

EXAM:
LEFT RIBS - 2 VIEW

[rib pa]
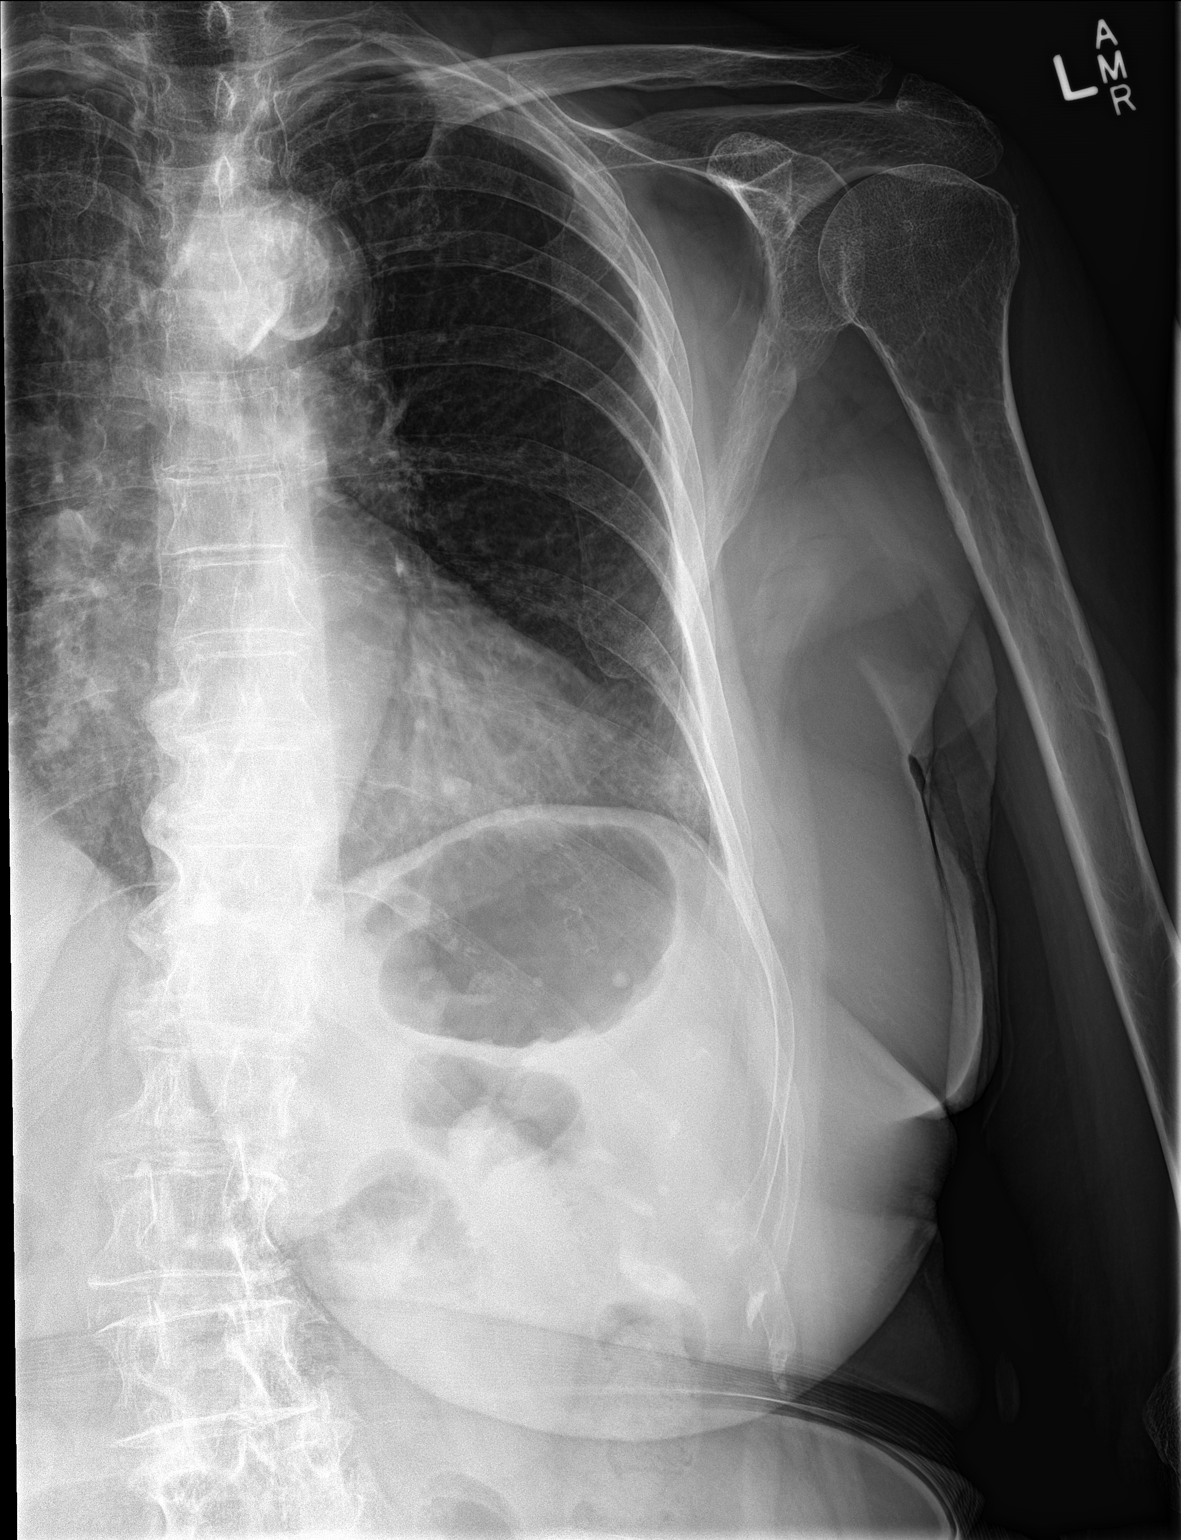

[rib obl]
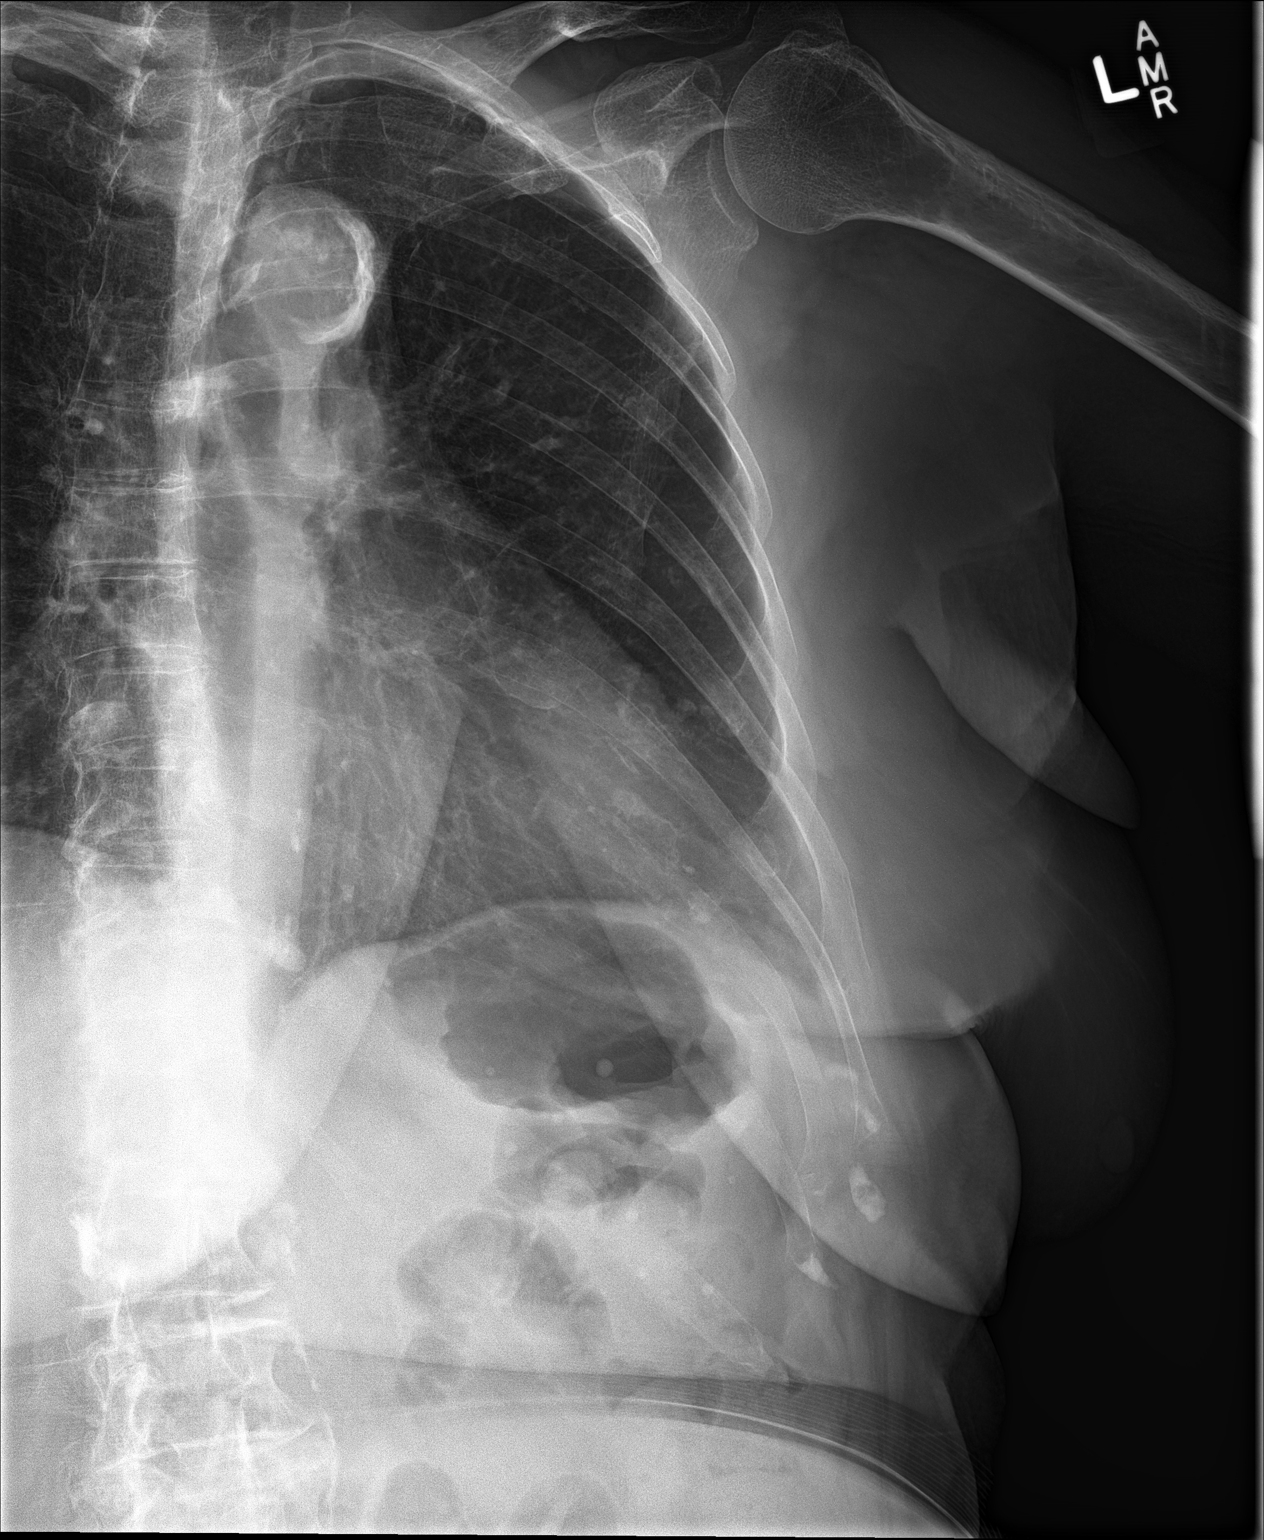

[2 of 2 positions shown; findings below may reference images not displayed]

FINDINGS: Two views of the left ribs reveal the bones to be subjectively
adequately mineralized. No acute or healing rib fracture is
observed. There is no pleural effusion or pneumothorax. There is
mild apical pleural thickening bilaterally. There is calcification
in the wall of the thoracic aorta. Possible partial compression of
the body of approximately T10.
IMPRESSION: There is no acute left rib fracture. Possible partial compression of
T10. A thoracic spine series would be useful.

## 2018-11-24 ENCOUNTER — Ambulatory Visit (HOSPITAL_COMMUNITY)
Admission: RE | Admit: 2018-11-24 | Discharge: 2018-11-24 | Disposition: A | Discharge status: Home / Self Care | Payer: Medicare Other | Source: Ambulatory Visit | Attending: Nurse Practitioner | Admitting: Nurse Practitioner

## 2018-11-24 ENCOUNTER — Other Ambulatory Visit: Payer: Self-pay

## 2018-11-24 ENCOUNTER — Other Ambulatory Visit (HOSPITAL_COMMUNITY): Payer: Self-pay | Admitting: Nurse Practitioner

## 2018-11-24 DIAGNOSIS — M542 Cervicalgia: Secondary | ICD-10-CM

## 2019-02-19 ENCOUNTER — Ambulatory Visit (INDEPENDENT_AMBULATORY_CARE_PROVIDER_SITE_OTHER): Payer: Medicare Other | Admitting: Orthopedic Surgery

## 2019-02-19 ENCOUNTER — Other Ambulatory Visit: Payer: Self-pay

## 2019-02-19 VITALS — BP 172/86 | HR 76 | Temp 96.4°F | Ht 63.0 in | Wt 173.4 lb

## 2019-02-19 DIAGNOSIS — M792 Neuralgia and neuritis, unspecified: Secondary | ICD-10-CM

## 2019-02-19 NOTE — Patient Instructions (Signed)
You have been scheduled for an MRI scan We will call your insurance company to do a precertification to get the MRI covered You will receive a phone call regarding the date of the scan 

## 2019-02-19 NOTE — Progress Notes (Signed)
Chief Complaint  Patient presents with  . Shoulder Pain    Left shoulder, request injection    Lumbar is 78 years old she has been followed for left shoulder pain for over a year we diagnosed her with left shoulder impingement syndrome after we noted an MRI did not show a rotator cuff tear.  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  She has painless range of motion of the shoulder but does have pain at terminal flexion with a positive impingement sign the cuff strength is normal in all directions.  Her cervical spine is tender especially posteriorly especially at the base of the cervical spine which evokes the pain that she is having.  Although her reflexes are intact she does have some weakness in the left upper extremity in terms of her biceps flexion.  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

## 2019-02-23 ENCOUNTER — Other Ambulatory Visit: Payer: Self-pay

## 2019-02-23 ENCOUNTER — Ambulatory Visit (HOSPITAL_COMMUNITY)
Admission: RE | Admit: 2019-02-23 | Discharge: 2019-02-23 | Disposition: A | Discharge status: Home / Self Care | Payer: Medicare Other | Source: Ambulatory Visit | Attending: Orthopedic Surgery | Admitting: Orthopedic Surgery

## 2019-02-23 DIAGNOSIS — M792 Neuralgia and neuritis, unspecified: Secondary | ICD-10-CM | POA: Insufficient documentation

## 2019-03-01 ENCOUNTER — Telehealth: Payer: Self-pay | Admitting: Orthopedic Surgery

## 2019-03-01 DIAGNOSIS — M792 Neuralgia and neuritis, unspecified: Secondary | ICD-10-CM

## 2019-03-01 NOTE — Telephone Encounter (Signed)
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 Progressive severe left facet arthropathy at C3-C4 with acute degenerative inflammatory changes and new severe left neuroforaminal stenosis. 2. 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 New moderate left neuroforaminal stenosis at C5-C6 with unchanged mild spinal canal stenosis.   Electronically Signed   By: Titus Dubin M.D.   On: 02/23/2019 13:02

## 2019-03-01 NOTE — Telephone Encounter (Signed)
Patient called and said she spoke with Dr. Aline Brochure this morning and he went over her MRI with her.  He asked her to call here and speak to you regarding her being sent to neurosurgeon.  Please call the patient when you have a few minutes per her request  Thanks

## 2019-03-01 NOTE — Telephone Encounter (Signed)
Thanks, referral will be sent, will you let her know she will get a call with appointment from [facility]?

## 2019-07-04 ENCOUNTER — Ambulatory Visit (INDEPENDENT_AMBULATORY_CARE_PROVIDER_SITE_OTHER): Payer: Medicare Other | Admitting: Orthopedic Surgery

## 2019-07-04 ENCOUNTER — Ambulatory Visit: Payer: Medicare Other

## 2019-07-04 ENCOUNTER — Encounter: Payer: Self-pay | Admitting: Orthopedic Surgery

## 2019-07-04 ENCOUNTER — Other Ambulatory Visit: Payer: Self-pay

## 2019-07-04 VITALS — BP 169/74 | HR 78 | Ht 63.5 in | Wt 165.0 lb

## 2019-07-04 DIAGNOSIS — M546 Pain in thoracic spine: Secondary | ICD-10-CM

## 2019-07-04 MED ORDER — METHYLPREDNISOLONE ACETATE 40 MG/ML IJ SUSP
40.0000 mg | Freq: Once | INTRAMUSCULAR | Status: AC
  Administered 2019-07-04: 40 mg via INTRAMUSCULAR

## 2019-07-04 NOTE — Progress Notes (Signed)
Sydell has seen [place] previously for neck pain and had an injection and she said that gave her significant improvement in her neck however right now she came in with a chief complaint of shoulder pain but actually points to the midportion of her upper thoracic spine just beneath the inferior aspect of the right shoulder blade the pain radiates along the rib cage and she has had that greater than a year  Review of systems joint pain depression she says was somewhat alleviated by taking vitamin D which she took for Covid  Denies ataxia dizziness or gait disturbance other than for her chronic knee pain  Past Medical History:  Diagnosis Date  . AC (acromioclavicular) joint bone spurs   . Arthritis   . Chronic fatigue   . Complication of anesthesia   . Fibromyalgia   . PONV (postoperative nausea and vomiting)   . Pre-diabetes   . Rheumatoid arthritis(714.0)   . Sciatica   . Vasculitis ([facility])     BP (!) 169/74   Pulse 78   Ht 5' 3.5" (1.613 m)   Wt 165 lb (74.8 kg)   BMI 28.77 kg/m   Pleasant lady alert and oriented x3 mood and affect normal  Obvious kyphosis thoracic spine tenderness at T8-9 somewhat in the soft tissues to the right of that.  Has full range of motion of the right shoulder except for the last bit of flexion and internal rotation  X-ray shows kyphosis and degenerative changes in the thoracic spine no fracture or bony destruction  Recommend 40 mg injection right hip I offered her therapy and told her to increase her vitamin D to 5000 units/day and she says she drinks enough milk to not need any calcium Intramuscular injection procedure note  The patient has complained of pain mid thoracic region  We have decided that the best treatment option to give the patient pain relief is to do an intramuscular injection of Depo-Medrol   The patient gave verbal consent, timeout was taken as appropriate. The nurse injected    40Mg  of Depo-Medrol into the  Hip/gluteal right

## 2019-07-04 NOTE — Patient Instructions (Signed)
Increase vit D to 5000mg  a day   Wait on physical therapy or try home therapy (hard to drive)

## 2019-08-22 ENCOUNTER — Encounter: Payer: Self-pay | Admitting: Internal Medicine

## 2019-09-28 ENCOUNTER — Emergency Department (HOSPITAL_COMMUNITY)
Admission: EM | Admit: 2019-09-28 | Discharge: 2019-09-28 | Disposition: A | Discharge status: Home / Self Care | Payer: Medicare Other | Attending: Emergency Medicine | Admitting: Emergency Medicine

## 2019-09-28 ENCOUNTER — Encounter (HOSPITAL_COMMUNITY): Payer: Self-pay | Admitting: *Deleted

## 2019-09-28 ENCOUNTER — Emergency Department (HOSPITAL_COMMUNITY): Payer: Medicare Other

## 2019-09-28 DIAGNOSIS — W19XXXA Unspecified fall, initial encounter: Secondary | ICD-10-CM

## 2019-09-28 DIAGNOSIS — S52615A Nondisplaced fracture of left ulna styloid process, initial encounter for closed fracture: Secondary | ICD-10-CM | POA: Insufficient documentation

## 2019-09-28 DIAGNOSIS — Y92009 Unspecified place in unspecified non-institutional (private) residence as the place of occurrence of the external cause: Secondary | ICD-10-CM | POA: Insufficient documentation

## 2019-09-28 DIAGNOSIS — Y999 Unspecified external cause status: Secondary | ICD-10-CM | POA: Diagnosis not present

## 2019-09-28 DIAGNOSIS — M069 Rheumatoid arthritis, unspecified: Secondary | ICD-10-CM | POA: Insufficient documentation

## 2019-09-28 DIAGNOSIS — S60922A Unspecified superficial injury of left hand, initial encounter: Secondary | ICD-10-CM | POA: Diagnosis present

## 2019-09-28 DIAGNOSIS — Y939 Activity, unspecified: Secondary | ICD-10-CM | POA: Insufficient documentation

## 2019-09-28 DIAGNOSIS — S300XXA Contusion of lower back and pelvis, initial encounter: Secondary | ICD-10-CM

## 2019-09-28 DIAGNOSIS — W01198A Fall on same level from slipping, tripping and stumbling with subsequent striking against other object, initial encounter: Secondary | ICD-10-CM | POA: Insufficient documentation

## 2019-09-28 DIAGNOSIS — I10 Essential (primary) hypertension: Secondary | ICD-10-CM | POA: Diagnosis not present

## 2019-09-28 DIAGNOSIS — R52 Pain, unspecified: Secondary | ICD-10-CM

## 2019-09-28 DIAGNOSIS — S52502A Unspecified fracture of the lower end of left radius, initial encounter for closed fracture: Secondary | ICD-10-CM | POA: Insufficient documentation

## 2019-09-28 DIAGNOSIS — Z79899 Other long term (current) drug therapy: Secondary | ICD-10-CM | POA: Insufficient documentation

## 2019-09-28 DIAGNOSIS — M797 Fibromyalgia: Secondary | ICD-10-CM | POA: Diagnosis not present

## 2019-09-28 NOTE — Discharge Instructions (Signed)
Keep your splint clean and dry.  Elevation and ice may continue to offer benefit.  Stabilization in the splint is the best treatment for pain relief however.  Call Dr. Romeo Apple on Tuesday for an office follow-up for further management of this injury as discussed.

## 2019-09-28 NOTE — ED Provider Notes (Signed)
[facility] EMERGENCY DEPARTMENT Provider Note   CSN: [id number] Arrival date & time: 09/28/19  1527     History Chief Complaint  Patient presents with  . Fall    Kiara [name] is a 79 y.o. female with a history of rheumatoid arthritis, and fibromyalgia on chronic pain medication hydrocodone presenting for evaluation of a fall which occurred approximately 2 weeks ago.  She slipped in her home, describing she generally wears shoes, but had slippery socks on when she slipped on her tile floor.  She fell backwards landing on her buttocks but also her left hand which was stretched behind her.  She denies hitting her head during this event.  She initially called EMS who evaluated her and felt she had contusions but no fractures so she chose not to be transported to the ED.  She presents today secondary to persistent pain and swelling in her left wrist and also pain in her posterior buttocks and "tailbone".  She denies weakness or numbness in her extremities, pain however is limiting her ability to use her left hand, she is right-handed.  She has used ice on the injury sites with improvement in the swelling.  As noted she takes hydrocodone at baseline for chronic pain relief.  She denies increased weakness, dizziness, also has no nausea or vomiting, headaches.  She is not on blood thinning medications.  The history is provided by the patient.       Past Medical History:  Diagnosis Date  . AC (acromioclavicular) joint bone spurs   . Arthritis   . Chronic fatigue   . Complication of anesthesia   . Fibromyalgia   . PONV (postoperative nausea and vomiting)   . Pre-diabetes   . Rheumatoid arthritis(714.0)   . Sciatica   . Vasculitis [facility])     Patient Active Problem List   Diagnosis Date Noted  . Rhabdomyolysis 09/05/2016  . Generalized weakness 09/05/2016  . Microcytic anemia 09/05/2016  . Aortic atherosclerosis (HCC) 09/05/2016  . Acute cystitis   . UTI (urinary tract infection)  09/04/2016  . Elevated CK 09/04/2016  . Fatigue 09/04/2016  . Lumbago 09/13/2011  . Pain in thoracic spine 09/13/2011  . Muscle weakness (generalized) 09/13/2011  . Back pain 09/01/2011  . Shoulder impingement 09/01/2011  . DDD (degenerative disc disease), lumbar 09/01/2011  . Arthritis of knee 08/05/2011  . Knee bursitis, left 08/05/2011  . Knee bursitis 08/05/2011  . Arthritis of knee, degenerative 08/05/2011  . Hip pain, right 06/02/2011  . Disorders of bursae and tendons in shoulder region, unspecified 11/05/2010  . DEGENERATIVE DISC DISEASE, CERVICAL SPINE 01/14/2010  . OTH SPEC D/O ROTATOR CUFF SYND SHLDR&ALLIED D/O 01/14/2010  . IMPINGEMENT SYNDROME 01/14/2010  . TOBACCO ABUSE 08/15/2009  . OTHER MALAISE AND FATIGUE 08/14/2009  . ANXIETY DISORDER 08/12/2009  . DEPRESSION 08/12/2009  . Essential hypertension 08/12/2009  . FIBROMYALGIA 08/12/2009  . IRRITABLE BOWEL SYNDROME, HX OF 08/12/2009    Past Surgical History:  Procedure Laterality Date  . ABDOMINAL HYSTERECTOMY    . ANUS SURGERY    . BACK SURGERY     3 back surgeries  . CATARACT EXTRACTION W/PHACO Left 08/08/2017   Procedure: CATARACT EXTRACTION PHACO AND INTRAOCULAR LENS PLACEMENT LEFT EYE;  Surgeon: Gemma Payor, MD;  Location: [facility];  Service: Ophthalmology;  Laterality: Left;  CDE: 19.49  . CATARACT EXTRACTION W/PHACO Right 09/05/2017   Procedure: CATARACT EXTRACTION PHACO AND INTRAOCULAR LENS PLACEMENT RIGHT EYE;  Surgeon: Gemma Payor, MD;  Location: [facility];  Service: Ophthalmology;  Laterality: Right;  CDE: 11.76  . CHOLECYSTECTOMY    . left knee    . TONSILLECTOMY    . TUBAL LIGATION       OB History   No obstetric history on file.     Family History  Problem Relation Age of Onset  . Heart disease Other   . Arthritis Other   . Diabetes Other     Social History   Tobacco Use  . Smoking status: Never Smoker  . Smokeless tobacco: Never Used  Vaping Use  . Vaping Use: Never used  Substance  Use Topics  . Alcohol use: No  . Drug use: No    Home Medications Prior to Admission medications   Medication Sig Start Date End Date Taking? Authorizing Provider  ALPRAZolam Prudy Feeler) 1 MG tablet Take 0.5-1 mg by mouth at bedtime as needed for anxiety.   Yes [provider]  amitriptyline (ELAVIL) 50 MG tablet Take 50 mg by mouth at bedtime.    Yes [provider]  b complex vitamins tablet Take 1 tablet by mouth daily.   Yes [provider]  HYDROcodone-acetaminophen (NORCO) 7.5-325 MG tablet Take 1 tablet by mouth 3 (three) times daily as needed for pain. 08/29/19  Yes [provider]  ibuprofen (ADVIL,MOTRIN) 200 MG tablet Take 200 mg by mouth daily as needed.    Yes [provider]  metoprolol tartrate (LOPRESSOR) 50 MG tablet Take by mouth 2 (two) times daily.    Yes [provider]  Multiple Vitamins-Minerals (MULTIVITAMIN ADULTS PO) Take 1 tablet by mouth daily.   Yes [provider]  omeprazole (PRILOSEC) 20 MG capsule Take 20 mg by mouth daily as needed.  12/27/11  Yes [provider]  sodium chloride (OCEAN) 0.65 % SOLN nasal spray Place 1 spray into both nostrils as needed for congestion.   Yes [provider]    Allergies    Nsaids, Celebrex [celecoxib], Gabapentin, and Sterapred [prednisone]  Review of Systems   Review of Systems  Constitutional: Negative for fever.  Respiratory: Negative.   Cardiovascular: Negative.   Gastrointestinal: Negative.   Musculoskeletal: Positive for arthralgias and joint swelling. Negative for myalgias.  Skin: Negative.   Neurological: Negative for weakness, numbness and headaches.  All other systems reviewed and are negative.   Physical Exam Updated Vital Signs BP (!) 141/73   Pulse 79   Temp 99.4 F (37.4 C)   Resp 20   Ht 5\' 3"  (1.6 m)   Wt 78.5 kg   SpO2 94%   BMI 30.65 kg/m   Physical Exam Constitutional:      Appearance: She is well-developed.    HENT:     Head: Atraumatic.  Cardiovascular:     Comments: Pulses equal bilaterally Musculoskeletal:        General: Tenderness present.     Left wrist: Swelling and bony tenderness present. No deformity or effusion.     Cervical back: Normal and normal range of motion.     Thoracic back: Normal.     Lumbar back: Tenderness and bony tenderness present.     Comments: Tender to palpation midline lumbar and paralumbar region.  No palpable deformity.  Moderate swelling and old nearly healed bruising noted at the left radial dorsal wrist.  Distal sensation is intact with less than 2-second cap refill.  Proximal forearm is nontender.  Radial pulses intact.  Skin:    General: Skin is warm and dry.  Neurological:  Mental Status: She is alert.     Sensory: No sensory deficit.     Deep Tendon Reflexes: Reflexes normal.     ED Results / Procedures / Treatments   Labs (all labs ordered are listed, but only abnormal results are displayed) Labs Reviewed - No data to display  EKG None  Radiology DG Lumbar Spine Complete  Result Date: 09/28/2019 CLINICAL DATA:  Status post fall. EXAM: LUMBAR SPINE - COMPLETE 4+ VIEW COMPARISON:  None. FINDINGS: There is no evidence of an acute lumbar spine fracture. There is mild to moderate severity dextroscoliosis. Mild-to-moderate severity multilevel endplate sclerosis is seen. This is most prominent at the levels of L4-L5 and L5-S1. Moderate to marked severity intervertebral disc space narrowing is also seen at the levels of L4-L5 and L5-S1. Mild to moderate severity intervertebral disc space narrowing is noted throughout the remainder of the lumbar spine. There is moderate to marked severity calcification of the abdominal aorta and bilateral common iliac arteries. Multiple radiopaque surgical clips are seen overlying the right upper quadrant. IMPRESSION: 1. No evidence of an acute lumbar spine fracture. 2. Moderate to marked severity degenerative disc  disease, most prominent at L4-L5 and L5-S1. Electronically Signed   By: Aram Candela M.D.   On: 09/28/2019 18:27   DG Wrist Complete Left  Result Date: 09/28/2019 CLINICAL DATA:  Status post fall 2 weeks ago. EXAM: LEFT WRIST - COMPLETE 3+ VIEW COMPARISON:  None. FINDINGS: Acute fracture is seen extending through the distal left radius. Extension to involve the radiocarpal articulation is noted. A mildly displaced fracture of the left ulnar styloid is also seen. There is no evidence of dislocation. Mild diffuse soft tissue swelling is seen. IMPRESSION: Acute fractures of the distal left radius and left ulnar styloid. Electronically Signed   By: Aram Candela M.D.   On: 09/28/2019 18:25   DG HIP UNILAT WITH PELVIS 2-3 VIEWS LEFT  Result Date: 09/28/2019 CLINICAL DATA:  Status post fall 2 weeks ago. EXAM: DG HIP (WITH OR WITHOUT PELVIS) 2-3V LEFT COMPARISON:  None. FINDINGS: There is no evidence of hip fracture or dislocation. There is no evidence of arthropathy or other focal bone abnormality. IMPRESSION: Negative. Electronically Signed   By: Aram Candela M.D.   On: 09/28/2019 18:24   DG HIP UNILAT WITH PELVIS 2-3 VIEWS RIGHT  Result Date: 09/28/2019 CLINICAL DATA:  Status post fall 2 weeks ago. EXAM: DG HIP (WITH OR WITHOUT PELVIS) 2-3V RIGHT COMPARISON:  None. FINDINGS: There is no evidence of hip fracture or dislocation. Moderate to marked severity degenerative changes seen within the visualized portion of the lower lumbar spine. Small phleboliths are seen within the pelvis on the right. IMPRESSION: No acute osseous abnormality. Electronically Signed   By: Aram Candela M.D.   On: 09/28/2019 18:28    Procedures Procedures (including critical care time)  SPLINT APPLICATION Date/Time: 7:00 PM Authorized by: Burgess Amor Consent: Verbal consent obtained. Risks and benefits: risks, benefits and alternatives were discussed Consent given by: patient Splint applied by: RN,  tech Location details: left forearm Splint type: sugar tong Supplies used: webril, splinting fiber, ace, sling Post-procedure: The splinted body part was neurovascularly unchanged following the procedure. Patient tolerance: Patient tolerated the procedure well with no immediate complications.     Medications Ordered in ED Medications - No data to display  ED Course  I have reviewed the triage vital signs and the nursing notes.  Pertinent labs & imaging results that were available during my care  of the patient were reviewed by me and considered in my medical decision making (see chart for details).    MDM Rules/Calculators/A&P                          Imaging reviewed and discussed with patient.  She was placed in a sugar tong splint and a sling provided.  She will need follow-up with orthopedics.  She already sees Dr. Romeo Apple on a routine basis for her arthritis symptoms.  She was encouraged to call for an appointment for follow-up care of this new injury.  She may continue using ice and elevation, although this is now a 86-week old injury.  She has hydrocodone for her chronic pain syndrome.  Return precautions discussed. Final Clinical Impression(s) / ED Diagnoses Final diagnoses:  Fall, initial encounter  Closed fracture of distal end of left radius, unspecified fracture morphology, initial encounter  Closed nondisplaced fracture of styloid process of left ulna, initial encounter  Contusion of buttock, initial encounter    Rx / DC Orders ED Discharge Orders    None       Victoriano Lain 09/28/19 1903    Derwood Kaplan, MD 09/28/19 2142

## 2019-09-28 NOTE — ED Triage Notes (Signed)
Fell over 2 weeks ago, c/o pain in both hips and left arm

## 2019-10-02 ENCOUNTER — Encounter: Payer: Self-pay | Admitting: Orthopedic Surgery

## 2019-10-02 ENCOUNTER — Ambulatory Visit (INDEPENDENT_AMBULATORY_CARE_PROVIDER_SITE_OTHER): Payer: Medicare Other | Admitting: Orthopedic Surgery

## 2019-10-02 ENCOUNTER — Telehealth: Payer: Self-pay

## 2019-10-02 ENCOUNTER — Other Ambulatory Visit: Payer: Self-pay

## 2019-10-02 VITALS — BP 144/72 | HR 78 | Ht 63.0 in | Wt 165.0 lb

## 2019-10-02 DIAGNOSIS — S52532A Colles' fracture of left radius, initial encounter for closed fracture: Secondary | ICD-10-CM | POA: Diagnosis not present

## 2019-10-02 DIAGNOSIS — W19XXXA Unspecified fall, initial encounter: Secondary | ICD-10-CM | POA: Diagnosis not present

## 2019-10-02 NOTE — Progress Notes (Signed)
Patient ID: Kiara [name], female   DOB: 11-28-1940, 78 y.o.   MRN: [id number]  Chief Complaint  Patient presents with  . Wrist Injury    ER follow up on left wrist fracture, DOI 08-29-19.    HPI Kiara [name] is a 79 y.o. female.   79 year old female got up to go to the door both feet gave out from under her she fell and landed injuring her left wrist  She complains of pain over the left wrist mild deformity.  X-rays were done in the emergency room.  She was placed in a splint at that time  She has a slightly angulated nondisplaced distal radius fracture of the left wrist   Review of Systems Review of Systems  All other systems reviewed and are negative.   denies numbness or tingling in the hand no skin lesions  Past Medical History:  Diagnosis Date  . AC (acromioclavicular) joint bone spurs   . Arthritis   . Chronic fatigue   . Complication of anesthesia   . Fibromyalgia   . PONV (postoperative nausea and vomiting)   . Pre-diabetes   . Rheumatoid arthritis(714.0)   . Sciatica   . Vasculitis [facility])     Past Surgical History:  Procedure Laterality Date  . ABDOMINAL HYSTERECTOMY    . ANUS SURGERY    . BACK SURGERY     3 back surgeries  . CATARACT EXTRACTION W/PHACO Left 08/08/2017   Procedure: CATARACT EXTRACTION PHACO AND INTRAOCULAR LENS PLACEMENT LEFT EYE;  Surgeon: Gemma Payor, MD;  Location: [facility];  Service: Ophthalmology;  Laterality: Left;  CDE: 19.49  . CATARACT EXTRACTION W/PHACO Right 09/05/2017   Procedure: CATARACT EXTRACTION PHACO AND INTRAOCULAR LENS PLACEMENT RIGHT EYE;  Surgeon: Gemma Payor, MD;  Location: [facility];  Service: Ophthalmology;  Laterality: Right;  CDE: 11.76  . CHOLECYSTECTOMY    . left knee    . TONSILLECTOMY    . TUBAL LIGATION        Physical Exam 1 Blood pressure (!) 144/72, pulse 78, height 5\' 3"  (1.6 m), weight 165 lb (74.8 kg). Physical Exam 2 The patient is well developed well nourished and well groomed. 3 Orientation to person  place and time is normal  4 Mood is pleasant.  5 Ambulatory status no limping today  6 Inspection of the left wrist reveals mild tenderness   mild swelling mild deformity 7 Range of motion assessment: The range of motion is diminished primarily secondary to pain 8 Stability tests are deferred because of pain but the x-ray shows no subluxation of the joint 9 Strength assessment muscle tone is normal resistance testing is deferred because of pain and swelling  10 Nerve function normal 11 Vascular function normal 12 Local lymphatic system negative  Opposite extremity left wrist there is no alignment abnormality, no contracture, no subluxation, no atrophy and neurovascular exam is intact  MEDICAL DECISION MAKING  A.  Encounter Diagnosis  Name Primary?  . Closed Colles' fracture of left radius, initial encounter Yes    B. DATA ANALYSED:    IMAGING: Independent interpretation of images: The distal radius shows slight dorsal tilt slight shortening slight loss of radial inclination but acceptable for closed treatment  Orders: None  Outside records reviewed: Emergency room  C. MANAGEMENT Short arm cast for 6 weeks x-ray out of plaster  No orders of the defined types were placed in this encounter.

## 2019-10-02 NOTE — Telephone Encounter (Signed)
FYI, pt cancelled new pt apt for this Friday 10/05/19 due to breaking her arm and having a tailbone injury. Pt will call back to r/s/.

## 2019-10-05 ENCOUNTER — Ambulatory Visit: Payer: Medicare Other | Admitting: Gastroenterology

## 2019-10-23 ENCOUNTER — Other Ambulatory Visit (HOSPITAL_COMMUNITY): Payer: Self-pay | Admitting: Family Medicine

## 2019-10-23 DIAGNOSIS — R634 Abnormal weight loss: Secondary | ICD-10-CM

## 2019-10-23 DIAGNOSIS — Z1231 Encounter for screening mammogram for malignant neoplasm of breast: Secondary | ICD-10-CM

## 2019-10-25 ENCOUNTER — Ambulatory Visit (HOSPITAL_COMMUNITY)
Admission: RE | Admit: 2019-10-25 | Discharge: 2019-10-25 | Disposition: A | Discharge status: Home / Self Care | Payer: Medicare Other | Source: Ambulatory Visit | Attending: Family Medicine | Admitting: Family Medicine

## 2019-10-25 ENCOUNTER — Other Ambulatory Visit: Payer: Self-pay

## 2019-10-25 DIAGNOSIS — R634 Abnormal weight loss: Secondary | ICD-10-CM | POA: Diagnosis present

## 2019-10-29 ENCOUNTER — Telehealth: Payer: Self-pay | Admitting: Orthopedic Surgery

## 2019-10-29 NOTE — Telephone Encounter (Signed)
Patient called following having numbness suddenly occurring in her legs while shopping at [employer]. States she had to get assistance from an employee to carry her groceries.  Asking if Dr Romeo Apple can send her to the back specialist he had referred in past. I relayed that without evaluating patient for this occurrence, we would be unable to refer at this time. Offered next available appointment - states that's a long while. Said she already called Dr Sudie Bailey, primary care, who was already treating for back problem, and that he is out of office this week. Discussed urgent care or emergency room - patient does not wish to go to either. York Spaniel will call back to primary care office, where there is another provider there, Dr Michelle Nasuti associate, for assistance.

## 2019-11-13 ENCOUNTER — Ambulatory Visit (INDEPENDENT_AMBULATORY_CARE_PROVIDER_SITE_OTHER): Payer: Medicare Other | Admitting: Orthopedic Surgery

## 2019-11-13 ENCOUNTER — Other Ambulatory Visit (HOSPITAL_COMMUNITY): Payer: Self-pay | Admitting: Family Medicine

## 2019-11-13 ENCOUNTER — Ambulatory Visit: Payer: Medicare Other

## 2019-11-13 ENCOUNTER — Encounter: Payer: Self-pay | Admitting: Orthopedic Surgery

## 2019-11-13 ENCOUNTER — Other Ambulatory Visit: Payer: Self-pay

## 2019-11-13 DIAGNOSIS — S52532A Colles' fracture of left radius, initial encounter for closed fracture: Secondary | ICD-10-CM

## 2019-11-13 DIAGNOSIS — M5431 Sciatica, right side: Secondary | ICD-10-CM

## 2019-11-13 DIAGNOSIS — R634 Abnormal weight loss: Secondary | ICD-10-CM

## 2019-11-13 DIAGNOSIS — S52532D Colles' fracture of left radius, subsequent encounter for closed fracture with routine healing: Secondary | ICD-10-CM | POA: Diagnosis not present

## 2019-11-13 NOTE — Progress Notes (Signed)
Fracture care   Chief Complaint  Patient presents with  . Wrist Injury    08/29/19 left wrist fracture     Xray fracture shows healing with slight dorsal tilt and shortening  Exam normal range of motion skin and sensation  Plan Exercise the hand   Limit lifting for 2 weeks  F/u prn

## 2019-11-13 NOTE — Patient Instructions (Signed)
Resume normal activity gradually

## 2019-11-14 ENCOUNTER — Ambulatory Visit (HOSPITAL_COMMUNITY)
Admission: RE | Admit: 2019-11-14 | Discharge: 2019-11-14 | Disposition: A | Discharge status: Home / Self Care | Payer: Medicare Other | Source: Ambulatory Visit | Attending: Family Medicine | Admitting: Family Medicine

## 2019-11-14 DIAGNOSIS — R634 Abnormal weight loss: Secondary | ICD-10-CM | POA: Diagnosis present

## 2019-11-14 DIAGNOSIS — M5431 Sciatica, right side: Secondary | ICD-10-CM

## 2019-11-14 LAB — POCT I-STAT CREATININE: Creatinine, Ser: 0.7 mg/dL (ref 0.44–1.00)

## 2019-11-14 MED ORDER — IOHEXOL 300 MG/ML  SOLN
100.0000 mL | Freq: Once | INTRAMUSCULAR | Status: AC | PRN
  Administered 2019-11-14: 100 mL via INTRAVENOUS

## 2019-11-14 MED ORDER — IOHEXOL 9 MG/ML PO SOLN
ORAL | Status: AC
  Filled 2019-11-14: qty 1000

## 2019-11-21 ENCOUNTER — Other Ambulatory Visit (HOSPITAL_COMMUNITY): Payer: Self-pay | Admitting: Family Medicine

## 2019-11-21 DIAGNOSIS — M79604 Pain in right leg: Secondary | ICD-10-CM

## 2019-11-21 DIAGNOSIS — M79605 Pain in left leg: Secondary | ICD-10-CM

## 2019-11-23 ENCOUNTER — Ambulatory Visit (HOSPITAL_COMMUNITY)
Admission: RE | Admit: 2019-11-23 | Discharge: 2019-11-23 | Disposition: A | Discharge status: Home / Self Care | Payer: Medicare Other | Source: Ambulatory Visit | Attending: Family Medicine | Admitting: Family Medicine

## 2019-11-23 ENCOUNTER — Other Ambulatory Visit: Payer: Self-pay

## 2019-11-23 DIAGNOSIS — M79604 Pain in right leg: Secondary | ICD-10-CM | POA: Diagnosis not present

## 2019-11-23 DIAGNOSIS — M79605 Pain in left leg: Secondary | ICD-10-CM | POA: Insufficient documentation

## 2019-12-12 ENCOUNTER — Encounter: Payer: Self-pay | Admitting: Internal Medicine

## 2019-12-14 ENCOUNTER — Ambulatory Visit: Payer: Medicare Other | Admitting: Cardiovascular Disease

## 2020-01-03 NOTE — Progress Notes (Signed)
Cardiology Office Note   Date:  01/04/2020   ID:  Kiara [name], DOB 1940-05-21, MRN [id number]  PCP:  Gareth Morgan, MD  Cardiologist:   Rollene Rotunda, MD Referring:  Gareth Morgan, MD  Chief Complaint  Patient presents with  . Palpitations      History of Present Illness: Kiara [name] is a 79 y.o. female who is referred by Gareth Morgan, MD for evaluation of palpitations and SOB.  She previously saw Dr. Dietrich Pates in 2011 for chest pain.  She had a negative stress echo.  She denies any ongoing chest pain.  However, she has had palpitations.  This has been going on for some time.  She described to her primary provider as regular pulse sometimes in the morning.  She mentions feeling somewhat short of breath with this or lightheaded.  She said it would be slow.  It would last for a few minutes and then go away.  She could not bring it on.  She did not have any associated presyncope or syncope.  She feels fatigued and sleeps quite a bit but this has been ongoing.  She was tested for sleep apnea years ago and was told that she had this a mild delay.  She now lives alone and says nobody witnesses her sleeping.  She gets around slowly because of back problems.  She walks around with a cane.  He has had blood work to include normal thyroid and electrolytes.  However, she is not has any other heart testing other than described.    Past Medical History:  Diagnosis Date  . AC (acromioclavicular) joint bone spurs   . Arthritis   . Chronic fatigue   . Complication of anesthesia   . Fibromyalgia   . PONV (postoperative nausea and vomiting)   . Pre-diabetes   . Rheumatoid arthritis(714.0)   . Sciatica   . Vasculitis [facility])     Past Surgical History:  Procedure Laterality Date  . ABDOMINAL HYSTERECTOMY    . ANUS SURGERY    . BACK SURGERY     3 back surgeries  . CATARACT EXTRACTION W/PHACO Left 08/08/2017   Procedure: CATARACT EXTRACTION PHACO AND INTRAOCULAR LENS PLACEMENT LEFT EYE;   Surgeon: Gemma Payor, MD;  Location: [facility];  Service: Ophthalmology;  Laterality: Left;  CDE: 19.49  . CATARACT EXTRACTION W/PHACO Right 09/05/2017   Procedure: CATARACT EXTRACTION PHACO AND INTRAOCULAR LENS PLACEMENT RIGHT EYE;  Surgeon: Gemma Payor, MD;  Location: [facility];  Service: Ophthalmology;  Laterality: Right;  CDE: 11.76  . CHOLECYSTECTOMY    . left knee    . TONSILLECTOMY    . TUBAL LIGATION       Current Outpatient Medications  Medication Sig Dispense Refill  . ALPRAZolam (XANAX) 1 MG tablet Take 0.5-1 mg by mouth at bedtime as needed for anxiety.    Marland Kitchen amitriptyline (ELAVIL) 50 MG tablet Take 50 mg by mouth at bedtime.     Marland Kitchen b complex vitamins tablet Take 1 tablet by mouth daily.    Marland Kitchen HYDROcodone-acetaminophen (NORCO) 7.5-325 MG tablet Take 1 tablet by mouth 3 (three) times daily as needed for pain.    Marland Kitchen ibuprofen (ADVIL,MOTRIN) 200 MG tablet Take 200 mg by mouth daily as needed.     . metoprolol tartrate (LOPRESSOR) 50 MG tablet Take by mouth 2 (two) times daily.     . Multiple Vitamins-Minerals (MULTIVITAMIN ADULTS PO) Take 1 tablet by mouth daily.    Marland Kitchen omeprazole (PRILOSEC) 20 MG  capsule Take 20 mg by mouth daily as needed.     . sodium chloride (OCEAN) 0.65 % SOLN nasal spray Place 1 spray into both nostrils as needed for congestion.     No current facility-administered medications for this visit.    Allergies:   Nsaids, Celebrex [celecoxib], Gabapentin, and Sterapred [prednisone]    ROS:  Please see the history of present illness.   Otherwise, review of systems are positive for none.   All other systems are reviewed and negative.    PHYSICAL EXAM: VS:  BP 115/68   Pulse 69   Temp (!) 94.5 F (34.7 C)   Ht 5\' 2"  (1.575 m)   Wt 151 lb 6.4 oz (68.7 kg)   SpO2 (!) 89%   BMI 27.69 kg/m  , BMI Body mass index is 27.69 kg/m. GENERAL:  Well appearing HEENT:  Pupils equal round and reactive, fundi not visualized, oral mucosa unremarkable NECK:  No jugular venous  distention, waveform within normal limits, carotid upstroke brisk and symmetric, no bruits, no thyromegaly LYMPHATICS:  No cervical, inguinal adenopathy LUNGS:  Clear to auscultation bilaterally BACK:  No CVA tenderness CHEST:  Unremarkable HEART:  PMI not displaced or sustained,S1 and S2 within normal limits, no S3, no S4, no clicks, no rubs, no murmurs ABD:  Flat, positive bowel sounds normal in frequency in pitch, no bruits, no rebound, no guarding, no midline pulsatile mass, no hepatomegaly, no splenomegaly EXT:  2 plus pulses throughout, no edema, no cyanosis no clubbing SKIN:  No rashes no nodules NEURO:  Cranial nerves II through XII grossly intact, motor grossly intact throughout PSYCH:  Cognitively intact, oriented to person place and time    EKG:  EKG is ordered today. The ekg ordered today demonstrates sinus rhythm, rate 69, axis within normal limits, intervals within normal limits, no acute ST-T wave changes.   Recent Labs: 11/14/2019: Creatinine, Ser 0.70    Lipid Panel    Component Value Date/Time   CHOL 205 03/31/2009 0000   TRIG 138 03/31/2009 0000   HDL 48 03/31/2009 0000   LDLCALC 129 03/31/2009 0000      Wt Readings from Last 3 Encounters:  01/04/20 151 lb 6.4 oz (68.7 kg)  10/02/19 165 lb (74.8 kg)  09/28/19 173 lb (78.5 kg)      Other studies Reviewed: Additional studies/ records that were reviewed today include: Labs and primary office notes.  Previous cardiology note.. Review of the above records demonstrates:  Please see elsewhere in the note.     ASSESSMENT AND PLAN:  Palpitations: I am going to apply a 2-week monitor.  Further management will be pending these results.  SOB: This has been chronic and probably related to her chronic back pain and some deconditioning related to this.  Dyslipidemia: She has an LDL of 145.  However, she has tried statins and has not tolerated them.  At this point I am not inclined to suggest PCSK9 in the absence  of any known vascular disease.  Covid education: She has been vaccinated.   Current medicines are reviewed at length with the patient today.  The patient does not have concerns regarding medicines.  The following changes have been made:  no change  Labs/ tests ordered today include:   Orders Placed This Encounter  Procedures  . LONG TERM MONITOR (3-14 DAYS)  . EKG 12-Lead     Disposition:   FU with me after the monitor.   Signed, Rollene Rotunda, MD  01/04/2020 4:04  PM    [facility]  '

## 2020-01-04 ENCOUNTER — Telehealth: Payer: Self-pay | Admitting: Radiology

## 2020-01-04 ENCOUNTER — Ambulatory Visit (INDEPENDENT_AMBULATORY_CARE_PROVIDER_SITE_OTHER): Payer: Medicare Other | Admitting: Cardiology

## 2020-01-04 ENCOUNTER — Encounter: Payer: Self-pay | Admitting: Cardiology

## 2020-01-04 ENCOUNTER — Other Ambulatory Visit: Payer: Self-pay

## 2020-01-04 VITALS — BP 115/68 | HR 69 | Temp 94.5°F | Ht 62.0 in | Wt 151.4 lb

## 2020-01-04 DIAGNOSIS — R002 Palpitations: Secondary | ICD-10-CM | POA: Diagnosis not present

## 2020-01-04 NOTE — Patient Instructions (Signed)
Medication Instructions:  Your physician recommends that you continue on your current medications as directed. Please refer to the Current Medication list given to you today.  *If you need a refill on your cardiac medications before your next appointment, please call your pharmacy*   Testing/Procedures: ZIO Monitor Instructions   Your physician has requested you wear your ZIO patch monitor for 14 days   This is a single patch monitor.  [employer] supplies one patch monitor per enrollment.  Additional stickers are not available.   Please do not apply patch if you will be having a Nuclear Stress Test, Echocardiogram, Cardiac CT, MRI, or Chest Xray during the time frame you would be wearing the monitor. The patch cannot be worn during these tests.  You cannot remove and re-apply the ZIO XT patch monitor.   Your ZIO patch monitor will be sent USPS Priority mail from [facility] directly to your home address. The monitor may also be mailed to a PO BOX if home delivery is not available.   It may take 3-5 days to receive your monitor after you have been enrolled.   Once you have received you monitor, please review enclosed instructions.  Your monitor has already been registered assigning a specific monitor serial # to you.   Applying the monitor   Shave hair from upper left chest.   Hold abrader disc by orange tab.  Rub abrader in 40 strokes over left upper chest as indicated in your monitor instructions.   Clean area with 4 enclosed alcohol pads .  Use all pads to assure are is cleaned thoroughly.  Let dry.   Apply patch as indicated in monitor instructions.  Patch will be place under collarbone on left side of chest with arrow pointing upward.   Rub patch adhesive wings for 2 minutes.Remove white label marked "1".  Remove white label marked "2".  Rub patch adhesive wings for 2 additional minutes.   While looking in a mirror, press and release button in center of patch.  A small green  light will flash 3-4 times .  This will be your only indicator the monitor has been turned on.     Do not shower for the first 24 hours.  You may shower after the first 24 hours.   Press button if you feel a symptom. You will hear a small click.  Record Date, Time and Symptom in the Patient Log Book.   When you are ready to remove patch, follow instructions on last 2 pages of Patient Log Book.  Stick patch monitor onto last Maye of Patient Log Book.   Place Patient Log Book in [place] box.  Use locking tab on box and tape box closed securely.  The [employer] and [employer] has [employer] on it.  Please place in mailbox as soon as possible.  Your physician should have your test results approximately 7 days after the monitor has been mailed back to [facility]   Call [facility] Customer Care at [phone] if you have questions regarding your ZIO XT patch monitor.  Call them immediately if you see an orange light blinking on your monitor.   If your monitor falls off in less than 4 days contact our Monitor department at [phone].  If your monitor becomes loose or falls off after 4 days call [employer] at [phone] for suggestions on securing your monitor.     Follow-Up: At [facility], you and your health needs are our priority.  As part of  our continuing mission to provide you with exceptional heart care, we have created designated Provider Care Teams.  These Care Teams include your primary Cardiologist (physician) and Advanced Practice Providers (APPs -  Physician Assistants and Nurse Practitioners) who all work together to provide you with the care you need, when you need it.  We recommend signing up for the patient portal called "MyChart".  Sign up information is provided on this After Visit Summary.  MyChart is used to connect with patients for Virtual Visits (Telemedicine).  Patients are able to view lab/test results, encounter notes, upcoming appointments, etc.  Non-urgent  messages can be sent to your provider as well.   To learn more about what you can do with MyChart, go to [email or web address].    Your next appointment:   4-6 week(s)  The format for your next appointment:   In Person  Provider:   You may see Rollene Rotunda, MD or one of the following Advanced Practice Providers on your designated Care Team:    Theodore Demark, PA-C  Joni Reining, DNP, ANP    Other Instructions

## 2020-01-04 NOTE — Telephone Encounter (Signed)
Enrolled patient for a 14 day Zio XT  monitor to be mailed to patients home  °

## 2020-01-07 ENCOUNTER — Encounter (HOSPITAL_COMMUNITY): Payer: Self-pay | Admitting: *Deleted

## 2020-01-07 ENCOUNTER — Emergency Department (HOSPITAL_COMMUNITY)
Admission: EM | Admit: 2020-01-07 | Discharge: 2020-01-07 | Disposition: A | Discharge status: Home / Self Care | Payer: Medicare Other | Attending: Emergency Medicine | Admitting: Emergency Medicine

## 2020-01-07 ENCOUNTER — Other Ambulatory Visit: Payer: Self-pay

## 2020-01-07 DIAGNOSIS — Z5321 Procedure and treatment not carried out due to patient leaving prior to being seen by health care provider: Secondary | ICD-10-CM | POA: Diagnosis not present

## 2020-01-07 DIAGNOSIS — R109 Unspecified abdominal pain: Secondary | ICD-10-CM | POA: Insufficient documentation

## 2020-01-07 LAB — COMPREHENSIVE METABOLIC PANEL
ALT: 123 U/L — ABNORMAL HIGH (ref 0–44)
AST: 120 U/L — ABNORMAL HIGH (ref 15–41)
Albumin: 4 g/dL (ref 3.5–5.0)
Alkaline Phosphatase: 76 U/L (ref 38–126)
Anion gap: 11 (ref 5–15)
BUN: 17 mg/dL (ref 8–23)
CO2: 28 mmol/L (ref 22–32)
Calcium: 9.6 mg/dL (ref 8.9–10.3)
Chloride: 97 mmol/L — ABNORMAL LOW (ref 98–111)
Creatinine, Ser: 0.61 mg/dL (ref 0.44–1.00)
GFR, Estimated: >60 mL/min (ref >60)
Glucose, Bld: 111 mg/dL — ABNORMAL HIGH (ref 70–99)
Potassium: 4.3 mmol/L (ref 3.5–5.1)
Sodium: 136 mmol/L (ref 135–145)
Total Bilirubin: 0.6 mg/dL (ref 0.3–1.2)
Total Protein: 7.3 g/dL (ref 6.5–8.1)

## 2020-01-07 LAB — CBC
HCT: 32.5 % — ABNORMAL LOW (ref 36.0–46.0)
Hemoglobin: 9.7 g/dL — ABNORMAL LOW (ref 12.0–15.0)
MCH: 19.3 pg — ABNORMAL LOW (ref 26.0–34.0)
MCHC: 29.8 g/dL — ABNORMAL LOW (ref 30.0–36.0)
MCV: 64.7 fL — ABNORMAL LOW (ref 80.0–100.0)
Platelets: 270 10*3/uL (ref 150–400)
RBC: 5.02 MIL/uL (ref 3.87–5.11)
RDW: 15.4 % (ref 11.5–15.5)
WBC: 6.4 10*3/uL (ref 4.0–10.5)
nRBC: 0 % (ref 0.0–0.2)

## 2020-01-07 LAB — LIPASE, BLOOD: Lipase: 39 U/L (ref 11–51)

## 2020-01-07 NOTE — ED Triage Notes (Signed)
Abdominal pain onset a week ago

## 2020-01-09 ENCOUNTER — Ambulatory Visit (INDEPENDENT_AMBULATORY_CARE_PROVIDER_SITE_OTHER): Payer: Medicare Other

## 2020-01-09 ENCOUNTER — Other Ambulatory Visit: Payer: Self-pay | Admitting: Nurse Practitioner

## 2020-01-09 DIAGNOSIS — R109 Unspecified abdominal pain: Secondary | ICD-10-CM

## 2020-01-09 DIAGNOSIS — R002 Palpitations: Secondary | ICD-10-CM | POA: Diagnosis not present

## 2020-01-17 ENCOUNTER — Ambulatory Visit (HOSPITAL_COMMUNITY): Payer: Medicare Other

## 2020-01-24 ENCOUNTER — Ambulatory Visit (HOSPITAL_COMMUNITY)
Admission: RE | Admit: 2020-01-24 | Discharge: 2020-01-24 | Disposition: A | Discharge status: Home / Self Care | Payer: Medicare Other | Source: Ambulatory Visit | Attending: Nurse Practitioner | Admitting: Nurse Practitioner

## 2020-01-24 ENCOUNTER — Other Ambulatory Visit: Payer: Self-pay

## 2020-01-24 DIAGNOSIS — R109 Unspecified abdominal pain: Secondary | ICD-10-CM | POA: Diagnosis not present

## 2020-01-31 NOTE — Progress Notes (Signed)
Cardiology Clinic Note   Patient Name: Kiara [name] Date of Encounter: 02/01/2020  Primary Care Provider:  Gareth Morgan, MD Primary Cardiologist:  Rollene Rotunda, MD  Patient Profile    Kiara [name] 79 year old female presents the clinic today to review the results of her cardiac event monitor.  Past Medical History    Past Medical History:  Diagnosis Date  . AC (acromioclavicular) joint bone spurs   . Arthritis   . Chronic fatigue   . Complication of anesthesia   . Fibromyalgia   . PONV (postoperative nausea and vomiting)   . Pre-diabetes   . Rheumatoid arthritis(714.0)   . Sciatica   . 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 ABDOMINAL HYSTERECTOMY    . ANUS SURGERY    . BACK SURGERY     3 back surgeries  . CATARACT EXTRACTION W/PHACO Left 08/08/2017   Procedure: CATARACT EXTRACTION PHACO AND INTRAOCULAR LENS PLACEMENT LEFT EYE;  Surgeon: Gemma Payor, MD;  Location: [facility];  Service: Ophthalmology;  Laterality: Left;  CDE: 19.49  . CATARACT EXTRACTION W/PHACO Right 09/05/2017   Procedure: CATARACT EXTRACTION PHACO AND INTRAOCULAR LENS PLACEMENT RIGHT EYE;  Surgeon: Gemma Payor, MD;  Location: [facility];  Service: Ophthalmology;  Laterality: Right;  CDE: 11.76  . CHOLECYSTECTOMY    . left knee    . TONSILLECTOMY    . TUBAL LIGATION      Allergies  Allergies  Allergen Reactions  . Nsaids Other (See Comments)    Patient says large doses cause shortness of breath, but she can take small doses  . Celebrex [Celecoxib] Nausea Only and Other (See Comments)    Patient states it made her nauseated and short-of-breath; she also says it did not work well for her  . Gabapentin Other (See Comments)    Had shocking sensations in legs  . Sterapred [Prednisone] Other (See Comments)    Patient says it affects her negatively; side-effects patient says, have strange results on her    History of Present Illness    Kiara [name] has a PMH of  essential hypertension, aortic atherosclerosis, fibromyalgia, rhabdomyolysis, generalized muscle weakness, tobacco abuse, depression, IBS, and palpitations.  She was last seen by Dr. Antoine Poche on 01/04/2020.  She had been referred by Dr. Sudie Bailey for evaluation of palpitations and shortness of breath.  She is previously seen by Dr. Dietrich Pates in 2011 for chest pain.  She underwent stress testing which was negative.  At the time of her visit with Dr. Antoine Poche she denied any ongoing chest pain.  She indicated that she had been having palpitations.  She indicated that she had shortness of breath or lightheadedness with her episodes.  The episodes would last for a few minutes before going away on their own.  She was not able to provoke episodes.  She denied presyncope and syncope.  She felt fatigued and indicated that she was getting them off sleep.  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  She indicated that she now lives alone and does not have anyone to tell her she stops breathing when she is asleep.  She is limited in her mobility due to chronic back problems.  She uses a cane with ambulation.  A cardiac event monitor was ordered and showed predominantly normal sinus rhythm, runs of SVT with the longest run being 17 beats, and rare ventricular ectopy.  Dr. Antoine Poche recommended if  this was bothersome Cardizem IR 30 mg twice daily could be added to her medication regimen.  She presents to the clinic today for follow-up evaluation and review of cardiac event monitor.  She states she feels fairly well and has been increasing her physical activity.  She  has been eating with her neighbors more regularly who have been making her meals.  She states that they use more salt than she usually does.  She has been working with physical therapy.  When asked about increased palpitations she states that due to her increased physical activity she has not noticed rapid heart rate or  increased palpitations.  I will give her the salty six diet sheet, have her continue to increase her physical activity, and follow-up in 3 months.  Today she denies chest pain, shortness of breath, lower extremity edema, fatigue, palpitations, melena, hematuria, hemoptysis, diaphoresis, weakness, presyncope, syncope, orthopnea, and PND.   Home Medications    Prior to Admission medications   Medication Sig Start Date End Date Taking? Authorizing Provider  ALPRAZolam Prudy Feeler) 1 MG tablet Take 0.5-1 mg by mouth at bedtime as needed for anxiety.    [provider]  amitriptyline (ELAVIL) 50 MG tablet Take 50 mg by mouth at bedtime.     [provider]  b complex vitamins tablet Take 1 tablet by mouth daily.    [provider]  HYDROcodone-acetaminophen (NORCO) 7.5-325 MG tablet Take 1 tablet by mouth 3 (three) times daily as needed for pain. 08/29/19   [provider]  ibuprofen (ADVIL,MOTRIN) 200 MG tablet Take 200 mg by mouth daily as needed.     [provider]  metoprolol tartrate (LOPRESSOR) 50 MG tablet Take by mouth 2 (two) times daily.     [provider]  Multiple Vitamins-Minerals (MULTIVITAMIN ADULTS PO) Take 1 tablet by mouth daily.    [provider]  omeprazole (PRILOSEC) 20 MG capsule Take 20 mg by mouth daily as needed.  12/27/11   [provider]  sodium chloride (OCEAN) 0.65 % SOLN nasal spray Place 1 spray into both nostrils as needed for congestion.    [provider]    Family History    Family History  Problem Relation Age of Onset  . Heart disease Other   . Arthritis Other   . Diabetes Other   . Heart disease Mother        Heart Failure  . Alzheimer's disease Father   . Thalassemia Father    She indicated that her mother is deceased. She indicated that her father is deceased.  Social History    Social History   Socioeconomic History  . Marital status: Divorced    Spouse name: Not on  file  . Number of children: Not on file  . Years of education: Not on file  . Highest education level: Not on file  Occupational History  . Occupation: retired    Associate Professor: RETIRED  Tobacco Use  . Smoking status: Never Smoker  . Smokeless tobacco: Never Used  Vaping Use  . Vaping Use: Never used  Substance and Sexual Activity  . Alcohol use: No  . Drug use: No  . Sexual activity: Not on file  Other Topics Concern  . Not on file  Social History Narrative  . Not on file   Social Determinants of Health   Financial Resource Strain:   . Difficulty of Paying Living Expenses: Not on file  Food Insecurity:   . Worried About Radiation protection practitioner  of Food in the Last Year: Not on file  . Ran Out of Food in the Last Year: Not on file  Transportation Needs:   . Lack of Transportation (Medical): Not on file  . Lack of Transportation (Non-Medical): Not on file  Physical Activity:   . Days of Exercise per Week: Not on file  . Minutes of Exercise per Session: Not on file  Stress:   . Feeling of Stress : Not on file  Social Connections:   . Frequency of Communication with Friends and Family: Not on file  . Frequency of Social Gatherings with Friends and Family: Not on file  . Attends Religious Services: Not on file  . Active Member of Clubs or Organizations: Not on file  . Attends Banker Meetings: Not on file  . Marital Status: Not on file  Intimate Partner Violence:   . Fear of Current or Ex-Partner: Not on file  . Emotionally Abused: Not on file  . Physically Abused: Not on file  . Sexually Abused: Not on file     Review of Systems    General:  No chills, fever, night sweats or weight changes.  Cardiovascular:  No chest pain, dyspnea on exertion, edema, orthopnea, palpitations, paroxysmal nocturnal dyspnea. Dermatological: No rash, lesions/masses Respiratory: No cough, dyspnea Urologic: No hematuria, dysuria Abdominal:   No nausea, vomiting, diarrhea, bright red blood  per rectum, melena, or hematemesis Neurologic:  No visual changes, wkns, changes in mental status. All other systems reviewed and are otherwise negative except as noted above.  Physical Exam    VS:  BP (!) 150/74   Pulse 76   Ht 5\' 2"  (1.575 m)   Wt 144 lb 3.2 oz (65.4 kg)   SpO2 98%   BMI 26.37 kg/m  , BMI Body mass index is 26.37 kg/m. GEN: Well nourished, well developed, in no acute distress. HEENT: normal. Neck: Supple, no JVD, carotid bruits, or masses. Cardiac: RRR, no murmurs, rubs, or gallops. No clubbing, cyanosis, edema.  Radials/DP/PT 2+ and equal bilaterally.  Respiratory:  Respirations regular and unlabored, clear to auscultation bilaterally. GI: Soft, nontender, nondistended, BS + x 4. MS: no deformity or atrophy. Skin: warm and dry, no rash. Neuro:  Strength and sensation are intact. Psych: Normal affect.  Accessory Clinical Findings    Recent Labs: 01/07/2020: ALT 123; BUN 17; Creatinine, Ser 0.61; Hemoglobin 9.7; Platelets 270; Potassium 4.3; Sodium 136   Recent Lipid Panel    Component Value Date/Time   CHOL 205 03/31/2009 0000   TRIG 138 03/31/2009 0000   HDL 48 03/31/2009 0000   LDLCALC 129 03/31/2009 0000    ECG personally reviewed by me today- none today.    Cardiac event monitor 01/30/2020 Normal sinus rhythm Runs of supraventricular tachycardia with the longest run being 17 beats Rare ventricular ectopy.  Cardizem IR 30 mg twice daily (about 8 hours apart) to see if that helps.  Assessment & Plan   1.  Palpitations-no recent episodes of increased heart rate with palpitations.  Cardiac event monitor showed normal sinus rhythm, SVT with the longest run being 17 beats, and rare ventricular ectopy.  Dr. Antoine Poche recommended Cardizem IR 30 mg twice daily have if her palpitations were bothersome. Continue metoprolol Heart healthy low-sodium diet-salty 6 given Increase physical activity as tolerated Avoid triggers caffeine, nicotine, chocolate,  EtOH etc.  Dyslipidemia-LDL 145.  Statin intolerant.  Discussed option of PCSK9.  She wishes to work on increasing fiber in her diet at this  time. Heart healthy low-sodium high-fiber diet Increase physical activity as tolerated  DOE-chronic.  Related to inactivity and lack of conditioning.  She is limited in her physical activity due to her chronic back pain. Increase physical activity as tolerated Heart healthy low-sodium diet  Disposition: Follow-up with Dr. Antoine Poche or me in 3 months.  Thomasene Ripple. Kang Ishida NP-C    02/01/2020, 3:52 PM [facility] [address] Office [phone] Fax [phone]  Notice: This dictation was prepared with Dragon dictation along with smaller phrase technology. Any transcriptional errors that result from this process are unintentional and may not be corrected upon review.

## 2020-02-01 ENCOUNTER — Ambulatory Visit (INDEPENDENT_AMBULATORY_CARE_PROVIDER_SITE_OTHER): Payer: Medicare Other | Admitting: General Practice

## 2020-02-01 ENCOUNTER — Encounter: Payer: Self-pay | Admitting: General Practice

## 2020-02-01 ENCOUNTER — Other Ambulatory Visit: Payer: Self-pay

## 2020-02-01 VITALS — BP 150/74 | HR 76 | Ht 62.0 in | Wt 144.2 lb

## 2020-02-01 DIAGNOSIS — R002 Palpitations: Secondary | ICD-10-CM | POA: Diagnosis not present

## 2020-02-01 DIAGNOSIS — E785 Hyperlipidemia, unspecified: Secondary | ICD-10-CM | POA: Diagnosis not present

## 2020-02-01 DIAGNOSIS — R0600 Dyspnea, unspecified: Secondary | ICD-10-CM

## 2020-02-01 DIAGNOSIS — R0609 Other forms of dyspnea: Secondary | ICD-10-CM

## 2020-02-01 NOTE — Patient Instructions (Signed)
Medication Instructions:  CONTINUE METOPROLOL *If you need a refill on your cardiac medications before your next appointment, please call your pharmacy*  Lab Work:   Testing/Procedures:  NONE    NONE  Special Instructions  PLEASE READ AND FOLLOW SALTY 6-ATTACHED-1,800mg  daily  Please try to avoid these triggers:  Do not use any products that have nicotine or tobacco in them. These include cigarettes, e-cigarettes, and chewing tobacco. If you need help quitting, ask your doctor.  Eat heart-healthy foods. Talk with your doctor about the right eating plan for you.  Exercise regularly as told by your doctor.  Do not drink alcohol, Caffeine or chocolate.  Lose weight if you are overweight.  Do not use drugs, including cannabis   Follow-Up: Your next appointment:  3 month(s) In Person with Rollene Rotunda, MD OR IF UNAVAILABLE JESSE CLEAVER, FNP-C   At [facility], you and your health needs are our priority.  As part of our continuing mission to provide you with exceptional heart care, we have created designated Provider Care Teams.  These Care Teams include your primary Cardiologist (physician) and Advanced Practice Providers (APPs -  Physician Assistants and Nurse Practitioners) who all work together to provide you with the care you need, when you need it.  We recommend signing up for the patient portal called "MyChart".  Sign up information is provided on this After Visit Summary.  MyChart is used to connect with patients for Virtual Visits (Telemedicine).  Patients are able to view lab/test results, encounter notes, upcoming appointments, etc.  Non-urgent messages can be sent to your provider as well.   To learn more about what you can do with MyChart, go to [email or web address].              6 SALTY THINGS TO AVOID     1,800MG  DAILY

## 2020-02-07 ENCOUNTER — Telehealth: Payer: Self-pay | Admitting: Cardiology

## 2020-02-07 NOTE — Telephone Encounter (Signed)
Attempted to returned pt's call. VM full.

## 2020-02-07 NOTE — Telephone Encounter (Signed)
Patient returning call for results for long term monitor from 11/03. Patient is fine with anyone calling to go over results. Please call/advise.   Thank you!

## 2020-02-08 NOTE — Telephone Encounter (Signed)
Patient says she got a phone call from [facility] regarding her heart monitor.  It appears [facility] has been trying to reach the patient to explain Dr. Jenene Slicker recommendation, however patient was seen by Edd Fabian, NP on 02/01/2020 who has already reviewed her monitor result and the Dr. Jenene Slicker recommendation.  No further recommendation is made at this time.  Patient is feeling okay.

## 2020-02-12 ENCOUNTER — Other Ambulatory Visit: Payer: Self-pay

## 2020-02-12 ENCOUNTER — Encounter: Payer: Self-pay | Admitting: *Deleted

## 2020-02-12 ENCOUNTER — Ambulatory Visit (INDEPENDENT_AMBULATORY_CARE_PROVIDER_SITE_OTHER): Payer: Medicare Other | Admitting: Gastroenterology

## 2020-02-12 ENCOUNTER — Encounter: Payer: Self-pay | Admitting: Gastroenterology

## 2020-02-12 VITALS — BP 134/67 | HR 72 | Temp 96.6°F | Ht 62.0 in | Wt 146.0 lb

## 2020-02-12 DIAGNOSIS — R7989 Other specified abnormal findings of blood chemistry: Secondary | ICD-10-CM | POA: Diagnosis not present

## 2020-02-12 DIAGNOSIS — D509 Iron deficiency anemia, unspecified: Secondary | ICD-10-CM

## 2020-02-12 DIAGNOSIS — R1310 Dysphagia, unspecified: Secondary | ICD-10-CM | POA: Diagnosis not present

## 2020-02-12 NOTE — Patient Instructions (Signed)
I recommend starting Benefiber 2 teaspoons mixed in your beverage of choice daily. You can use Miralax 1 capful with 8 ounces of water each evening as needed for constipation.  Please have blood work done at [facility] today. We will call with results!  We are arranging an upper endoscopy with dilation in near future by Dr. Marletta Lor.  I would like to see you back in 2 months for follow-up!  It was a pleasure to see you today. I want to create trusting relationships with patients to provide genuine, compassionate, and quality care. I value your feedback. If you receive a survey regarding your visit,  I greatly appreciate you taking time to fill this out.   Gelene Mink, PhD, ANP-BC [facility]

## 2020-02-12 NOTE — Progress Notes (Signed)
Primary Care Physician:  Gareth Morgan, MD  Referring Physician: Dillard Essex, NP/Dr. Sudie Bailey Primary Gastroenterologist:  Dr. Marletta Lor  Chief Complaint  Patient presents with  . Dysphagia    food, liquids; states she doesn't talk right    HPI:   Kiara [name] is a 79 y.o. female presenting today at the request of Dillard Essex, NP/Dr. Sudie Bailey due to dysphagia.   Feels like if she talks too much, her glands get irritated and throat is raw. Has globus sensation chronically. Tries to avoid foods that aggravate reflux. Feels like she is choking on food and liquids. Has pill dysphagia. Has had issues for about 6 years. Has lost about 30 lbs this year per patient. Broke her wrist earlier this year and not cooking as much. Teeth are not fitting right. Wants to figure out what is going on with throat before teeth fixed. No typical reflux symptoms. Softer foods are easier to tolerate. No abdominal pain. Not eating as much fiber. Feels more sluggish with bowel movements. Used to be regular every morning but now may go a few days without a BM. Will use suppositories, MOM.   Transaminases acutely elevated in Oct 2021 with AST 120, ALT 123. Had pain under right rib in early fall. Unclear if this is when LFTs were elevated. US abdomen with gallbladder absent, mild intrahepatic and extrahepatic ductal dilation, likely postsurgical. Possible fatty liver.   Colonoscopy about 20 years ago. Diverticula per patient. Unknown if any polyps. No family history of colon cancer. No prior EGD.   Nov 2020: 173. Today 146. Aug 2021 CT abdomen with contrast (not pelvis), with inra and extrahepatic biliary dilatation, no obvious common bile duct stones, ampullary mass, or pancreatic head mass.   Past Medical History:  Diagnosis Date  . AC (acromioclavicular) joint bone spurs   . Arthritis   . Chronic fatigue   . Complication of anesthesia   . Fibromyalgia   . PONV (postoperative nausea and vomiting)   .  Pre-diabetes   . Rheumatoid arthritis(714.0)   . Sciatica   . 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 ABDOMINAL HYSTERECTOMY    . ANUS SURGERY    . BACK SURGERY     3 back surgeries  . CATARACT EXTRACTION W/PHACO Left 08/08/2017   Procedure: CATARACT EXTRACTION PHACO AND INTRAOCULAR LENS PLACEMENT LEFT EYE;  Surgeon: Gemma Payor, MD;  Location: [facility];  Service: Ophthalmology;  Laterality: Left;  CDE: 19.49  . CATARACT EXTRACTION W/PHACO Right 09/05/2017   Procedure: CATARACT EXTRACTION PHACO AND INTRAOCULAR LENS PLACEMENT RIGHT EYE;  Surgeon: Gemma Payor, MD;  Location: [facility];  Service: Ophthalmology;  Laterality: Right;  CDE: 11.76  . CHOLECYSTECTOMY    . left knee    . TONSILLECTOMY    . TUBAL LIGATION      Current Outpatient Medications  Medication Sig Dispense Refill  . ALPRAZolam (XANAX) 1 MG tablet Take 0.5-1 mg by mouth at bedtime as needed for anxiety.    Marland Kitchen amitriptyline (ELAVIL) 50 MG tablet Take 50 mg by mouth at bedtime.     Marland Kitchen b complex vitamins tablet Take 1 tablet by mouth daily.    Marland Kitchen HYDROcodone-acetaminophen (NORCO) 7.5-325 MG tablet Take 1 tablet by mouth 3 (three) times daily as needed for pain.    Marland Kitchen ibuprofen (ADVIL,MOTRIN) 200 MG tablet Take 200 mg by mouth daily as needed.     . metoprolol tartrate (LOPRESSOR) 50 MG tablet Take  by mouth 2 (two) times daily.     . Multiple Vitamins-Minerals (MULTIVITAMIN ADULTS PO) Take 1 tablet by mouth daily.    . sodium chloride (OCEAN) 0.65 % SOLN nasal spray Place 1 spray into both nostrils as needed for congestion.     . venlafaxine XR (EFFEXOR-XR) 37.5 MG 24 hr capsule TAKE 1 CAPSULE BY MOUTHTONCE DAILY./UE    . pantoprazole (PROTONIX) 40 MG tablet Take 40 mg by mouth daily. (Patient not taking: Reported on 02/12/2020)     No current facility-administered medications for this visit.    Allergies as of 02/12/2020 - Review Complete 02/12/2020  Allergen Reaction Noted  . Nsaids Other  (See Comments) 08/12/2009  . Celebrex [celecoxib] Nausea Only and Other (See Comments) 07/23/2010  . Gabapentin Other (See Comments) 11/07/2014  . Sterapred [prednisone] Other (See Comments) 11/05/2010    Family History  Problem Relation Age of Onset  . Heart disease Other   . Arthritis Other   . Diabetes Other   . Heart disease Mother        Heart Failure  . Alzheimer's disease Father   . Thalassemia Father     Social History   Socioeconomic History  . Marital status: Divorced    Spouse name: Not on file  . Number of children: Not on file  . Years of education: Not on file  . Highest education level: Not on file  Occupational History  . Occupation: retired    Employer: RETIRED  Tobacco Use  . Smoking status: Former Smoker    Types: Cigarettes  . Smokeless tobacco: Never Used  . Tobacco comment: quit 1999  Vaping Use  . Vaping Use: Never used  Substance and Sexual Activity  . Alcohol use: No  . Drug use: No  . Sexual activity: Not on file  Other Topics Concern  . Not on file  Social History Narrative  . Not on file   Social Determinants of Health   Financial Resource Strain:   . Difficulty of Paying Living Expenses: Not on file  Food Insecurity:   . Worried About Running Out of Food in the Last Year: Not on file  . Ran Out of Food in the Last Year: Not on file  Transportation Needs:   . Lack of Transportation (Medical): Not on file  . Lack of Transportation (Non-Medical): Not on file  Physical Activity:   . Days of Exercise per Week: Not on file  . Minutes of Exercise per Session: Not on file  Stress:   . Feeling of Stress : Not on file  Social Connections:   . Frequency of Communication with Friends and Family: Not on file  . Frequency of Social Gatherings with Friends and Family: Not on file  . Attends Religious Services: Not on file  . Active Member of Clubs or Organizations: Not on file  . Attends Club or Organization Meetings: Not on file  .  Marital Status: Not on file  Intimate Partner Violence:   . Fear of Current or Ex-Partner: Not on file  . Emotionally Abused: Not on file  . Physically Abused: Not on file  . Sexually Abused: Not on file    Review of Systems: Gen: Denies any fever, chills, fatigue, weight loss, lack of appetite.  CV: Denies chest pain, heart palpitations, peripheral edema, syncope.  Resp: Denies shortness of breath at rest or with exertion. Denies wheezing or cough.  GI: see HPI GU : Denies urinary burning, urinary frequency, urinary hesitancy MS: Denies   joint pain, muscle weakness, cramps, or limitation of movement.  Derm: Denies rash, itching, dry skin Psych: Denies depression, anxiety, memory loss, and confusion Heme: Denies bruising, bleeding, and enlarged lymph nodes.  Physical Exam: BP 134/67   Pulse 72   Temp (!) 96.6 F (35.9 C) (Temporal)   Ht 5\' 2"  (1.575 m)   Wt 146 lb (66.2 kg)   BMI 26.70 kg/m  General:   Alert and oriented. Pleasant and cooperative. Well-nourished and well-developed.  Head:  Normocephalic and atraumatic. Eyes:  Without icterus, sclera clear and conjunctiva pink.  Ears:  Normal auditory acuity. Mouth:  Mask in place  Lungs:  Clear to auscultation bilaterally. No wheezes, rales, or rhonchi. No distress.  Heart:  S1, S2 present without murmurs appreciated.  Abdomen:  +BS, soft, non-tender and non-distended. No HSM noted. No guarding or rebound. No masses appreciated.  Rectal:  Deferred  Msk:  Symmetrical without gross deformities. Normal posture. Extremities:  Without edema. Neurologic:  Alert and  oriented x4;  grossly normal neurologically. Skin:  Intact without significant lesions or rashes. Psych:  Alert and cooperative. Normal mood and affect.  ASSESSMENT: Kiara [name] is a 79 y.o. female presenting today at the request of PCP due to dysphagia. Review of chart also reveals chronic microcytic anemia and incidence of elevated transaminases in Oct 2021 with  AST 120, ALT 123. Self-reported weight loss by patient as well, which is documented in chart.   Dysphagia: will pursue EGD/dilation in near future. Chronic globus sensation also reported.   Microcytic anemia: check iron studies today. Chronic. No overt GI bleeding. Last colonoscopy approximately 20 years ago per patient. With significant UGI symptoms, will pursue EGD first and may ultimately need colonoscopy if evidence for IDA. Mild constipation to be treated with supplemental fiber.   Elevated transaminases in Oct 2021: Nov 2021 abdomen with gallbladder absent,mild intrahepatic and extrahepatic ductal dilation, likely postsurgical. Possible fatty liver. Recheck HFP now.     Weight loss: Documented nearly 30 lbs since Nov 2020. Aug 2021 CT abdomen with contrast (not pelvis), with inra and extrahepatic biliary dilatation, no obvious common bile duct stones, ampullary mass, or pancreatic head mass. EGD due to dysphagia as planned. Query related to decreased intake in setting of dysphagia. No signs of chronic mesenteric ischemia.    PLAN:  Proceed with upper endoscopy by Dr. Sep 2021 in near future: the risks, benefits, and alternatives have been discussed with the patient in detail. The patient states understanding and desires to proceed.   Benefiber 2 teaspoons daily, Miralax as needed  CBC, CMP, iron studies today  Return for close follow-up in 2 months   Marletta Lor, PhD, [facility]

## 2020-02-12 NOTE — H&P (View-Only) (Signed)
Primary Care Physician:  Gareth Morgan, MD  Referring Physician: Dillard Essex, NP/Dr. Sudie Bailey Primary Gastroenterologist:  Dr. Marletta Lor  Chief Complaint  Patient presents with  . Dysphagia    food, liquids; states she doesn't talk right    HPI:   Kiara [name] is a 79 y.o. female presenting today at the request of Dillard Essex, NP/Dr. Sudie Bailey due to dysphagia.   Feels like if she talks too much, her glands get irritated and throat is raw. Has globus sensation chronically. Tries to avoid foods that aggravate reflux. Feels like she is choking on food and liquids. Has pill dysphagia. Has had issues for about 6 years. Has lost about 30 lbs this year per patient. Broke her wrist earlier this year and not cooking as much. Teeth are not fitting right. Wants to figure out what is going on with throat before teeth fixed. No typical reflux symptoms. Softer foods are easier to tolerate. No abdominal pain. Not eating as much fiber. Feels more sluggish with bowel movements. Used to be regular every morning but now may go a few days without a BM. Will use suppositories, MOM.   Transaminases acutely elevated in Oct 2021 with AST 120, ALT 123. Had pain under right rib in early fall. Unclear if this is when LFTs were elevated. US abdomen with gallbladder absent, mild intrahepatic and extrahepatic ductal dilation, likely postsurgical. Possible fatty liver.   Colonoscopy about 20 years ago. Diverticula per patient. Unknown if any polyps. No family history of colon cancer. No prior EGD.   Nov 2020: 173. Today 146. Aug 2021 CT abdomen with contrast (not pelvis), with inra and extrahepatic biliary dilatation, no obvious common bile duct stones, ampullary mass, or pancreatic head mass.   Past Medical History:  Diagnosis Date  . AC (acromioclavicular) joint bone spurs   . Arthritis   . Chronic fatigue   . Complication of anesthesia   . Fibromyalgia   . PONV (postoperative nausea and vomiting)   .  Pre-diabetes   . Rheumatoid arthritis(714.0)   . Sciatica   . 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 ABDOMINAL HYSTERECTOMY    . ANUS SURGERY    . BACK SURGERY     3 back surgeries  . CATARACT EXTRACTION W/PHACO Left 08/08/2017   Procedure: CATARACT EXTRACTION PHACO AND INTRAOCULAR LENS PLACEMENT LEFT EYE;  Surgeon: Gemma Payor, MD;  Location: [facility];  Service: Ophthalmology;  Laterality: Left;  CDE: 19.49  . CATARACT EXTRACTION W/PHACO Right 09/05/2017   Procedure: CATARACT EXTRACTION PHACO AND INTRAOCULAR LENS PLACEMENT RIGHT EYE;  Surgeon: Gemma Payor, MD;  Location: [facility];  Service: Ophthalmology;  Laterality: Right;  CDE: 11.76  . CHOLECYSTECTOMY    . left knee    . TONSILLECTOMY    . TUBAL LIGATION      Current Outpatient Medications  Medication Sig Dispense Refill  . ALPRAZolam (XANAX) 1 MG tablet Take 0.5-1 mg by mouth at bedtime as needed for anxiety.    Marland Kitchen amitriptyline (ELAVIL) 50 MG tablet Take 50 mg by mouth at bedtime.     Marland Kitchen b complex vitamins tablet Take 1 tablet by mouth daily.    Marland Kitchen HYDROcodone-acetaminophen (NORCO) 7.5-325 MG tablet Take 1 tablet by mouth 3 (three) times daily as needed for pain.    Marland Kitchen ibuprofen (ADVIL,MOTRIN) 200 MG tablet Take 200 mg by mouth daily as needed.     . metoprolol tartrate (LOPRESSOR) 50 MG tablet Take  by mouth 2 (two) times daily.     . Multiple Vitamins-Minerals (MULTIVITAMIN ADULTS PO) Take 1 tablet by mouth daily.    . sodium chloride (OCEAN) 0.65 % SOLN nasal spray Place 1 spray into both nostrils as needed for congestion.     Marland Kitchen venlafaxine XR (EFFEXOR-XR) 37.5 MG 24 hr capsule TAKE 1 CAPSULE BY MOUTHTONCE DAILY./UE    . pantoprazole (PROTONIX) 40 MG tablet Take 40 mg by mouth daily. (Patient not taking: Reported on 02/12/2020)     No current facility-administered medications for this visit.    Allergies as of 02/12/2020 - Review Complete 02/12/2020  Allergen Reaction Noted  . Nsaids Other  (See Comments) 08/12/2009  . Celebrex [celecoxib] Nausea Only and Other (See Comments) 07/23/2010  . Gabapentin Other (See Comments) 11/07/2014  . Sterapred [prednisone] Other (See Comments) 11/05/2010    Family History  Problem Relation Age of Onset  . Heart disease Other   . Arthritis Other   . Diabetes Other   . Heart disease Mother        Heart Failure  . Alzheimer's disease Father   . Thalassemia Father     Social History   Socioeconomic History  . Marital status: Divorced    Spouse name: Not on file  . Number of children: Not on file  . Years of education: Not on file  . Highest education level: Not on file  Occupational History  . Occupation: retired    Associate Professor: RETIRED  Tobacco Use  . Smoking status: Former Smoker    Types: Cigarettes  . Smokeless tobacco: Never Used  . Tobacco comment: quit 1999  Vaping Use  . Vaping Use: Never used  Substance and Sexual Activity  . Alcohol use: No  . Drug use: No  . Sexual activity: Not on file  Other Topics Concern  . Not on file  Social History Narrative  . Not on file   Social Determinants of Health   Financial Resource Strain:   . Difficulty of Paying Living Expenses: Not on file  Food Insecurity:   . Worried About Programme researcher, broadcasting/film/video in the Last Year: Not on file  . Ran Out of Food in the Last Year: Not on file  Transportation Needs:   . Lack of Transportation (Medical): Not on file  . Lack of Transportation (Non-Medical): Not on file  Physical Activity:   . Days of Exercise per Week: Not on file  . Minutes of Exercise per Session: Not on file  Stress:   . Feeling of Stress : Not on file  Social Connections:   . Frequency of Communication with Friends and Family: Not on file  . Frequency of Social Gatherings with Friends and Family: Not on file  . Attends Religious Services: Not on file  . Active Member of Clubs or Organizations: Not on file  . Attends Banker Meetings: Not on file  .  Marital Status: Not on file  Intimate Partner Violence:   . Fear of Current or Ex-Partner: Not on file  . Emotionally Abused: Not on file  . Physically Abused: Not on file  . Sexually Abused: Not on file    Review of Systems: Gen: Denies any fever, chills, fatigue, weight loss, lack of appetite.  CV: Denies chest pain, heart palpitations, peripheral edema, syncope.  Resp: Denies shortness of breath at rest or with exertion. Denies wheezing or cough.  GI: see HPI GU : Denies urinary burning, urinary frequency, urinary hesitancy MS: Denies  joint pain, muscle weakness, cramps, or limitation of movement.  Derm: Denies rash, itching, dry skin Psych: Denies depression, anxiety, memory loss, and confusion Heme: Denies bruising, bleeding, and enlarged lymph nodes.  Physical Exam: BP 134/67   Pulse 72   Temp (!) 96.6 F (35.9 C) (Temporal)   Ht 5\' 2"  (1.575 m)   Wt 146 lb (66.2 kg)   BMI 26.70 kg/m  General:   Alert and oriented. Pleasant and cooperative. Well-nourished and well-developed.  Head:  Normocephalic and atraumatic. Eyes:  Without icterus, sclera clear and conjunctiva pink.  Ears:  Normal auditory acuity. Mouth:  Mask in place  Lungs:  Clear to auscultation bilaterally. No wheezes, rales, or rhonchi. No distress.  Heart:  S1, S2 present without murmurs appreciated.  Abdomen:  +BS, soft, non-tender and non-distended. No HSM noted. No guarding or rebound. No masses appreciated.  Rectal:  Deferred  Msk:  Symmetrical without gross deformities. Normal posture. Extremities:  Without edema. Neurologic:  Alert and  oriented x4;  grossly normal neurologically. Skin:  Intact without significant lesions or rashes. Psych:  Alert and cooperative. Normal mood and affect.  ASSESSMENT: Kiara [name] is a 79 y.o. female presenting today at the request of PCP due to dysphagia. Review of chart also reveals chronic microcytic anemia and incidence of elevated transaminases in Oct 2021 with  AST 120, ALT 123. Self-reported weight loss by patient as well, which is documented in chart.   Dysphagia: will pursue EGD/dilation in near future. Chronic globus sensation also reported.   Microcytic anemia: check iron studies today. Chronic. No overt GI bleeding. Last colonoscopy approximately 20 years ago per patient. With significant UGI symptoms, will pursue EGD first and may ultimately need colonoscopy if evidence for IDA. Mild constipation to be treated with supplemental fiber.   Elevated transaminases in Oct 2021: Nov 2021 abdomen with gallbladder absent,mild intrahepatic and extrahepatic ductal dilation, likely postsurgical. Possible fatty liver. Recheck HFP now.     Weight loss: Documented nearly 30 lbs since Nov 2020. Aug 2021 CT abdomen with contrast (not pelvis), with inra and extrahepatic biliary dilatation, no obvious common bile duct stones, ampullary mass, or pancreatic head mass. EGD due to dysphagia as planned. Query related to decreased intake in setting of dysphagia. No signs of chronic mesenteric ischemia.    PLAN:  Proceed with upper endoscopy by Dr. Sep 2021 in near future: the risks, benefits, and alternatives have been discussed with the patient in detail. The patient states understanding and desires to proceed.   Benefiber 2 teaspoons daily, Miralax as needed  CBC, CMP, iron studies today  Return for close follow-up in 2 months   Marletta Lor, PhD, [facility]

## 2020-02-13 ENCOUNTER — Encounter: Payer: Self-pay | Admitting: *Deleted

## 2020-02-16 LAB — CBC WITH DIFFERENTIAL
Basophils Absolute: 0.1 10*3/uL (ref 0.0–0.2)
Basos: 1 %
EOS (ABSOLUTE): 0.1 10*3/uL (ref 0.0–0.4)
Eos: 2 %
Hematocrit: 34.3 % (ref 34.0–46.6)
Hemoglobin: 10.4 g/dL — ABNORMAL LOW (ref 11.1–15.9)
Immature Grans (Abs): 0 10*3/uL (ref 0.0–0.1)
Immature Granulocytes: 0 %
Lymphocytes Absolute: 2 10*3/uL (ref 0.7–3.1)
Lymphs: 27 %
MCH: 19.9 pg — ABNORMAL LOW (ref 26.6–33.0)
MCHC: 30.3 g/dL — ABNORMAL LOW (ref 31.5–35.7)
MCV: 66 fL — ABNORMAL LOW (ref 79–97)
Monocytes Absolute: 0.7 10*3/uL (ref 0.1–0.9)
Monocytes: 10 %
Neutrophils Absolute: 4.4 10*3/uL (ref 1.4–7.0)
Neutrophils: 60 %
RBC: 5.22 x10E6/uL (ref 3.77–5.28)
RDW: 15.7 % — ABNORMAL HIGH (ref 11.7–15.4)
WBC: 7.3 10*3/uL (ref 3.4–10.8)

## 2020-02-16 LAB — COMPREHENSIVE METABOLIC PANEL
ALT: 10 IU/L (ref 0–32)
AST: 17 IU/L (ref 0–40)
Albumin/Globulin Ratio: 2.3 — ABNORMAL HIGH (ref 1.2–2.2)
Albumin: 4.6 g/dL (ref 3.7–4.7)
Alkaline Phosphatase: 66 IU/L (ref 44–121)
BUN/Creatinine Ratio: 14 (ref 12–28)
BUN: 10 mg/dL (ref 8–27)
Bilirubin Total: 0.3 mg/dL (ref 0.0–1.2)
CO2: 27 mmol/L (ref 20–29)
Calcium: 9.7 mg/dL (ref 8.7–10.3)
Chloride: 98 mmol/L (ref 96–106)
Creatinine, Ser: 0.74 mg/dL (ref 0.57–1.00)
GFR calc Af Amer: 89 mL/min/{1.73_m2} (ref >59)
GFR calc non Af Amer: 77 mL/min/{1.73_m2} (ref >59)
Globulin, Total: 2 g/dL (ref 1.5–4.5)
Glucose: 104 mg/dL — ABNORMAL HIGH (ref 65–99)
Potassium: 4.4 mmol/L (ref 3.5–5.2)
Sodium: 139 mmol/L (ref 134–144)
Total Protein: 6.6 g/dL (ref 6.0–8.5)

## 2020-02-16 LAB — IRON,TIBC AND FERRITIN PANEL
Ferritin: 249 ng/mL — ABNORMAL HIGH (ref 15–150)
Iron Saturation: 17 % (ref 15–55)
Iron: 45 ug/dL (ref 27–139)
Total Iron Binding Capacity: 267 ug/dL (ref 250–450)
UIBC: 222 ug/dL (ref 118–369)

## 2020-02-17 DIAGNOSIS — R7989 Other specified abnormal findings of blood chemistry: Secondary | ICD-10-CM | POA: Insufficient documentation

## 2020-02-20 NOTE — Progress Notes (Signed)
Dear Bonita Quin We have tried to reach you on several occasions regarding your lab results. Can you please call our office at your earliest convenience.      Thank you, Westley Foots, CMA

## 2020-03-06 NOTE — Patient Instructions (Signed)
Kiara [name]  03/06/2020     @PREFPERIOPPHARMACY @   Your procedure is scheduled on  03/11/2020.  Report to [facility] at  1230  P.M.  Call this number if you have problems the morning of surgery:  [phone]   Remember:  Follow the diet and prep instructions given to you by the office.                      Take these medicines the morning of surgery with A SIP OF WATER  Hydrocodone(if needed), metoprolol, effexor.    Do not wear jewelry, make-up or nail polish.  Do not wear lotions, powders, or perfumes. Please wear deodorant and brush your teeth.  Do not shave 48 hours prior to surgery.  Men may shave face and neck.  Do not bring valuables to the hospital.  [facility] is not responsible for any belongings or valuables.  Contacts, dentures or bridgework may not be worn into surgery.  Leave your suitcase in the car.  After surgery it may be brought to your room.  For patients admitted to the hospital, discharge time will be determined by your treatment team.  Patients discharged the day of surgery will not be allowed to drive home.   Name and phone number of your driver:   family Special instructions:   DO NOT smoke the morning of your procedure.  Please read over the following fact sheets that you were given. Anesthesia Post-op Instructions and Care and Recovery After Surgery       Upper Endoscopy, Adult, Care After This sheet gives you information about how to care for yourself after your procedure. Your health care provider may also give you more specific instructions. If you have problems or questions, contact your health care provider. What can I expect after the procedure? After the procedure, it is common to have:  A sore throat.  Mild stomach pain or discomfort.  Bloating.  Nausea. Follow these instructions at home:   Follow instructions from your health care provider about what to eat or drink after your procedure.  Return to your normal  activities as told by your health care provider. Ask your health care provider what activities are safe for you.  Take over-the-counter and prescription medicines only as told by your health care provider.  Do not drive for 24 hours if you were given a sedative during your procedure.  Keep all follow-up visits as told by your health care provider. This is important. Contact a health care provider if you have:  A sore throat that lasts longer than one day.  Trouble swallowing. Get help right away if:  You vomit blood or your vomit looks like coffee grounds.  You have: ? A fever. ? Bloody, black, or tarry stools. ? A severe sore throat or you cannot swallow. ? Difficulty breathing. ? Severe pain in your chest or abdomen. Summary  After the procedure, it is common to have a sore throat, mild stomach discomfort, bloating, and nausea.  Do not drive for 24 hours if you were given a sedative during the procedure.  Follow instructions from your health care provider about what to eat or drink after your procedure.  Return to your normal activities as told by your health care provider. This information is not intended to replace advice given to you by your health care provider. Make sure you discuss any questions you have with your health care  provider. Document Revised: 09/06/2017 Document Reviewed: 08/15/2017 Elsevier Patient Education  [place].  Esophageal Dilatation Esophageal dilatation, also called esophageal dilation, is a procedure to widen or open (dilate) a blocked or narrowed part of the esophagus. The esophagus is the part of the body that moves food and liquid from the mouth to the stomach. You may need this procedure if:  You have a buildup of scar tissue in your esophagus that makes it difficult, painful, or impossible to swallow. This can be caused by gastroesophageal reflux disease (GERD).  You have cancer of the esophagus.  There is a problem with how food  moves through your esophagus. In some cases, you may need this procedure repeated at a later time to dilate the esophagus gradually. Tell a health care provider about:  Any allergies you have.  All medicines you are taking, including vitamins, herbs, eye drops, creams, and over-the-counter medicines.  Any problems you or family members have had with anesthetic medicines.  Any blood disorders you have.  Any surgeries you have had.  Any medical conditions you have.  Any antibiotic medicines you are required to take before dental procedures.  Whether you are pregnant or may be pregnant. What are the risks? Generally, this is a safe procedure. However, problems may occur, including:  Bleeding due to a tear in the lining of the esophagus.  A hole (perforation) in the esophagus. What happens before the procedure?  Follow instructions from your health care provider about eating or drinking restrictions.  Ask your health care provider about changing or stopping your regular medicines. This is especially important if you are taking diabetes medicines or blood thinners.  Plan to have someone take you home from the hospital or clinic.  Plan to have a responsible adult care for you for at least 24 hours after you leave the hospital or clinic. This is important. What happens during the procedure?  You may be given a medicine to help you relax (sedative).  A numbing medicine may be sprayed into the back of your throat, or you may gargle the medicine.  Your health care provider may perform the dilatation using various surgical instruments, such as: ? Simple dilators. This instrument is carefully placed in the esophagus to stretch it. ? Guided wire bougies. This involves using an endoscope to insert a wire into the esophagus. A dilator is passed over this wire to enlarge the esophagus. Then the wire is removed. ? Balloon dilators. An endoscope with a small balloon at the end is inserted  into the esophagus. The balloon is inflated to stretch the esophagus and open it up. The procedure may vary among health care providers and hospitals. What happens after the procedure?  Your blood pressure, heart rate, breathing rate, and blood oxygen level will be monitored until the medicines you were given have worn off.  Your throat may feel slightly sore and numb. This will improve slowly over time.  You will not be allowed to eat or drink until your throat is no longer numb.  When you are able to drink, urinate, and sit on the edge of the bed without nausea or dizziness, you may be able to return home. Follow these instructions at home:  Take over-the-counter and prescription medicines only as told by your health care provider.  Do not drive for 24 hours if you were given a sedative during your procedure.  You should have a responsible adult with you for 24 hours after the procedure.  Follow instructions from your health care provider about any eating or drinking restrictions.  Do not use any products that contain nicotine or tobacco, such as cigarettes and e-cigarettes. If you need help quitting, ask your health care provider.  Keep all follow-up visits as told by your health care provider. This is important. Get help right away if you:  Have a fever.  Have chest pain.  Have pain that is not relieved by medication.  Have trouble breathing.  Have trouble swallowing.  Vomit blood. Summary  Esophageal dilatation, also called esophageal dilation, is a procedure to widen or open (dilate) a blocked or narrowed part of the esophagus.  Plan to have someone take you home from the hospital or clinic.  For this procedure, a numbing medicine may be sprayed into the back of your throat, or you may gargle the medicine.  Do not drive for 24 hours if you were given a sedative during your procedure. This information is not intended to replace advice given to you by your health  care provider. Make sure you discuss any questions you have with your health care provider. Document Revised: 01/10/2019 Document Reviewed: 01/18/2017 Elsevier Patient Education  2020 [place] After These instructions provide you with information about caring for yourself after your procedure. Your health care provider may also give you more specific instructions. Your treatment has been planned according to current medical practices, but problems sometimes occur. Call your health care provider if you have any problems or questions after your procedure. What can I expect after the procedure? After your procedure, you may:  Feel sleepy for several hours.  Feel clumsy and have poor balance for several hours.  Feel forgetful about what happened after the procedure.  Have poor judgment for several hours.  Feel nauseous or vomit.  Have a sore throat if you had a breathing tube during the procedure. Follow these instructions at home: For at least 24 hours after the procedure:      Have a responsible adult stay with you. It is important to have someone help care for you until you are awake and alert.  Rest as needed.  Do not: ? Participate in activities in which you could fall or become injured. ? Drive. ? Use heavy machinery. ? Drink alcohol. ? Take sleeping pills or medicines that cause drowsiness. ? Make important decisions or sign legal documents. ? Take care of children on your own. Eating and drinking  Follow the diet that is recommended by your health care provider.  If you vomit, drink water, juice, or soup when you can drink without vomiting.  Make sure you have little or no nausea before eating solid foods. General instructions  Take over-the-counter and prescription medicines only as told by your health care provider.  If you have sleep apnea, surgery and certain medicines can increase your risk for breathing problems. Follow  instructions from your health care provider about wearing your sleep device: ? Anytime you are sleeping, including during daytime naps. ? While taking prescription pain medicines, sleeping medicines, or medicines that make you drowsy.  If you smoke, do not smoke without supervision.  Keep all follow-up visits as told by your health care provider. This is important. Contact a health care provider if:  You keep feeling nauseous or you keep vomiting.  You feel light-headed.  You develop a rash.  You have a fever. Get help right away if:  You have trouble breathing. Summary  For several  hours after your procedure, you may feel sleepy and have poor judgment.  Have a responsible adult stay with you for at least 24 hours or until you are awake and alert. This information is not intended to replace advice given to you by your health care provider. Make sure you discuss any questions you have with your health care provider. Document Revised: 06/13/2017 Document Reviewed: 07/06/2015 Elsevier Patient Education  [place].

## 2020-03-07 ENCOUNTER — Other Ambulatory Visit (HOSPITAL_COMMUNITY)
Admission: RE | Admit: 2020-03-07 | Discharge: 2020-03-07 | Disposition: A | Discharge status: Home / Self Care | Payer: Medicare Other | Source: Ambulatory Visit | Attending: Internal Medicine | Admitting: Internal Medicine

## 2020-03-07 ENCOUNTER — Other Ambulatory Visit: Payer: Self-pay

## 2020-03-07 ENCOUNTER — Encounter (HOSPITAL_COMMUNITY)
Admission: RE | Admit: 2020-03-07 | Discharge: 2020-03-07 | Disposition: A | Discharge status: Home / Self Care | Payer: Medicare Other | Source: Ambulatory Visit | Attending: Internal Medicine | Admitting: Internal Medicine

## 2020-03-07 DIAGNOSIS — Z20822 Contact with and (suspected) exposure to covid-19: Secondary | ICD-10-CM | POA: Diagnosis not present

## 2020-03-07 DIAGNOSIS — Z01812 Encounter for preprocedural laboratory examination: Secondary | ICD-10-CM | POA: Diagnosis present

## 2020-03-08 LAB — SARS CORONAVIRUS 2 (TAT 6-24 HRS): SARS Coronavirus 2: NEGATIVE

## 2020-03-11 ENCOUNTER — Encounter (HOSPITAL_COMMUNITY): Payer: Self-pay

## 2020-03-11 ENCOUNTER — Ambulatory Visit (HOSPITAL_COMMUNITY)
Admission: RE | Admit: 2020-03-11 | Discharge: 2020-03-11 | Disposition: A | Discharge status: Home / Self Care | Payer: Medicare Other | Attending: Internal Medicine | Admitting: Internal Medicine

## 2020-03-11 ENCOUNTER — Encounter (HOSPITAL_COMMUNITY)
Admission: RE | Disposition: A | Discharge status: Home / Self Care | Payer: Self-pay | Source: Home / Self Care | Attending: Internal Medicine

## 2020-03-11 ENCOUNTER — Ambulatory Visit (HOSPITAL_COMMUNITY): Payer: Medicare Other | Admitting: Certified Registered"

## 2020-03-11 DIAGNOSIS — D509 Iron deficiency anemia, unspecified: Secondary | ICD-10-CM | POA: Diagnosis not present

## 2020-03-11 DIAGNOSIS — R1310 Dysphagia, unspecified: Secondary | ICD-10-CM | POA: Insufficient documentation

## 2020-03-11 DIAGNOSIS — K319 Disease of stomach and duodenum, unspecified: Secondary | ICD-10-CM | POA: Diagnosis not present

## 2020-03-11 DIAGNOSIS — Z6824 Body mass index (BMI) 24.0-24.9, adult: Secondary | ICD-10-CM | POA: Insufficient documentation

## 2020-03-11 DIAGNOSIS — K222 Esophageal obstruction: Secondary | ICD-10-CM | POA: Diagnosis not present

## 2020-03-11 DIAGNOSIS — K2100 Gastro-esophageal reflux disease with esophagitis, without bleeding: Secondary | ICD-10-CM

## 2020-03-11 DIAGNOSIS — Z87891 Personal history of nicotine dependence: Secondary | ICD-10-CM | POA: Diagnosis not present

## 2020-03-11 DIAGNOSIS — Z79899 Other long term (current) drug therapy: Secondary | ICD-10-CM | POA: Insufficient documentation

## 2020-03-11 DIAGNOSIS — R634 Abnormal weight loss: Secondary | ICD-10-CM | POA: Insufficient documentation

## 2020-03-11 DIAGNOSIS — K5900 Constipation, unspecified: Secondary | ICD-10-CM | POA: Diagnosis not present

## 2020-03-11 DIAGNOSIS — K2970 Gastritis, unspecified, without bleeding: Secondary | ICD-10-CM | POA: Diagnosis not present

## 2020-03-11 HISTORY — PX: BALLOON DILATION: SHX5330

## 2020-03-11 HISTORY — PX: BIOPSY: SHX5522

## 2020-03-11 HISTORY — PX: ESOPHAGOGASTRODUODENOSCOPY (EGD) WITH PROPOFOL: SHX5813

## 2020-03-11 HISTORY — PX: ESOPHAGEAL BRUSHING: SHX6842

## 2020-03-11 LAB — GLUCOSE, CAPILLARY
Glucose-Capillary: 97 mg/dL (ref 70–99)
Glucose-Capillary: 88 mg/dL (ref 70–99)

## 2020-03-11 LAB — KOH PREP: KOH Prep: NONE SEEN

## 2020-03-11 SURGERY — ESOPHAGOGASTRODUODENOSCOPY (EGD) WITH PROPOFOL
Anesthesia: General

## 2020-03-11 MED ORDER — STERILE WATER FOR IRRIGATION IR SOLN
Status: DC | PRN
  Administered 2020-03-11: 13:00:00 100 mL

## 2020-03-11 MED ORDER — OMEPRAZOLE 20 MG PO CPDR: 20.0000 mg | DELAYED_RELEASE_CAPSULE | Freq: Two times a day (BID) | ORAL | 5 refills | Status: AC

## 2020-03-11 MED ORDER — LACTATED RINGERS IV SOLN
INTRAVENOUS | Status: DC
  Administered 2020-03-11: 13:00:00 1000 mL via INTRAVENOUS

## 2020-03-11 MED ORDER — PROPOFOL 10 MG/ML IV BOLUS
INTRAVENOUS | Status: DC | PRN
  Administered 2020-03-11: 80 mg via INTRAVENOUS
  Administered 2020-03-11: 150 ug/kg/min via INTRAVENOUS

## 2020-03-11 MED ORDER — LIDOCAINE HCL (CARDIAC) PF 100 MG/5ML IV SOSY
PREFILLED_SYRINGE | INTRAVENOUS | Status: DC | PRN
  Administered 2020-03-11: 80 mg via INTRAVENOUS

## 2020-03-11 NOTE — Interval H&P Note (Signed)
History and Physical Interval Note:  03/11/2020 1:25 PM  Kiara [name]  has presented today for surgery, with the diagnosis of dysphagia.  The various methods of treatment have been discussed with the patient and family. After consideration of risks, benefits and other options for treatment, the patient has consented to  Procedure(s) with comments: ESOPHAGOGASTRODUODENOSCOPY (EGD) WITH PROPOFOL (N/A) - 3:00pm BALLOON DILATION (N/A) as a surgical intervention.  The patient's history has been reviewed, patient examined, no change in status, stable for surgery.  I have reviewed the patient's chart and labs.  Questions were answered to the patient's satisfaction.     Lanelle Bal

## 2020-03-11 NOTE — Transfer of Care (Signed)
Immediate Anesthesia Transfer of Care Note  Patient: Kiara [name]  Procedure(s) Performed: ESOPHAGOGASTRODUODENOSCOPY (EGD) WITH PROPOFOL (N/A ) BALLOON DILATION (N/A ) BIOPSY ESOPHAGEAL BRUSHING  Patient Location: PACU  Anesthesia Type:General  Level of Consciousness: awake, alert , oriented and patient cooperative  Airway & Oxygen Therapy: Patient Spontanous Breathing  Post-op Assessment: Report given to RN, Post -op Vital signs reviewed and stable and Patient moving all extremities  Post vital signs: Reviewed and stable  Last Vitals:  Vitals Value Taken Time  BP    Temp    Pulse    Resp    SpO2      Last Pain:  Vitals:   03/11/20 1331  TempSrc:   PainSc: 0-No pain      Patients Stated Pain Goal: 8 (03/11/20 1252)  Complications: No complications documented.

## 2020-03-11 NOTE — Anesthesia Postprocedure Evaluation (Signed)
Anesthesia Post Note  Patient: Allina Riches Buttacavoli  Procedure(s) Performed: ESOPHAGOGASTRODUODENOSCOPY (EGD) WITH PROPOFOL (N/A ) BALLOON DILATION (N/A ) BIOPSY ESOPHAGEAL BRUSHING  Anesthesia Type: General Level of consciousness: awake, oriented, awake and alert and patient cooperative Pain management: pain level controlled Vital Signs Assessment: post-procedure vital signs reviewed and stable Respiratory status: spontaneous breathing, respiratory function stable and nonlabored ventilation Cardiovascular status: blood pressure returned to baseline Postop Assessment: no backache and no headache Anesthetic complications: no   No complications documented.   Last Vitals:  Vitals:   03/11/20 1252  BP: (!) 158/71  Resp: 16  Temp: 37.2 C  SpO2: 97%    Last Pain:  Vitals:   03/11/20 1331  TempSrc:   PainSc: 0-No pain                 Brynda Peon

## 2020-03-11 NOTE — Anesthesia Preprocedure Evaluation (Signed)
Anesthesia Evaluation  Patient identified by MRN, date of birth, ID band Patient awake    Reviewed: Allergy & Precautions, H&P , NPO status , Patient's Chart, lab work & pertinent test results, reviewed documented beta blocker date and time   History of Anesthesia Complications (+) PONV and history of anesthetic complications  Airway Mallampati: II  TM Distance: >3 FB Neck ROM: full    Dental no notable dental hx. (+) Edentulous Upper, Edentulous Lower   Pulmonary neg pulmonary ROS, former smoker,    Pulmonary exam normal breath sounds clear to auscultation       Cardiovascular Exercise Tolerance: Good hypertension,  Rhythm:regular Rate:Normal     Neuro/Psych PSYCHIATRIC DISORDERS Anxiety Depression  Neuromuscular disease    GI/Hepatic negative GI ROS, Neg liver ROS,   Endo/Other  negative endocrine ROS  Renal/GU negative Renal ROS  negative genitourinary   Musculoskeletal   Abdominal   Peds  Hematology  (+) Blood dyscrasia, anemia ,   Anesthesia Other Findings   Reproductive/Obstetrics negative OB ROS                             Anesthesia Physical Anesthesia Plan  ASA: III  Anesthesia Plan: General   Post-op Pain Management:    Induction:   PONV Risk Score and Plan: Propofol infusion  Airway Management Planned:   Additional Equipment:   Intra-op Plan:   Post-operative Plan:   Informed Consent: I have reviewed the patients History and Physical, chart, labs and discussed the procedure including the risks, benefits and alternatives for the proposed anesthesia with the patient or authorized representative who has indicated his/her understanding and acceptance.     Dental Advisory Given  Plan Discussed with: CRNA  Anesthesia Plan Comments:         Anesthesia Quick Evaluation

## 2020-03-11 NOTE — Discharge Instructions (Addendum)
EGD Discharge instructions Please read the instructions outlined below and refer to this sheet in the next few weeks. These discharge instructions provide you with general information on caring for yourself after you leave the hospital. Your doctor may also give you specific instructions. While your treatment has been planned according to the most current medical practices available, unavoidable complications occasionally occur. If you have any problems or questions after discharge, please call your doctor. ACTIVITY  You may resume your regular activity but move at a slower pace for the next 24 hours.   Take frequent rest periods for the next 24 hours.   Walking will help expel (get rid of) the air and reduce the bloated feeling in your abdomen.   No driving for 24 hours (because of the anesthesia (medicine) used during the test).   You may shower.   Do not sign any important legal documents or operate any machinery for 24 hours (because of the anesthesia used during the test).  NUTRITION  Drink plenty of fluids.   You may resume your normal diet.   Begin with a light meal and progress to your normal diet.   Avoid alcoholic beverages for 24 hours or as instructed by your caregiver.  MEDICATIONS  You may resume your normal medications unless your caregiver tells you otherwise.  WHAT YOU CAN EXPECT TODAY  You may experience abdominal discomfort such as a feeling of fullness or "gas" pains.  FOLLOW-UP  Your doctor will discuss the results of your test with you.  SEEK IMMEDIATE MEDICAL ATTENTION IF ANY OF THE FOLLOWING OCCUR:  Excessive nausea (feeling sick to your stomach) and/or vomiting.   Severe abdominal pain and distention (swelling).   Trouble swallowing.   Temperature over 101 F (37.8 C).   Rectal bleeding or vomiting of blood.    Your EGD showed a moderate amount of inflammation in your stomach.  I biopsied this to rule out infection with a bacteria called H.  pylori.  You also had evidence of inflammation in your esophagus which could be contributing to your swallowing difficulties.  Await pathology results, my office will contact you.  I am going to start you on a new medication called omeprazole 20 mg twice daily.  I want you to take this 30 minutes before breakfast and 30 minutes before dinner.  I did dilate your esophagus as well, hopefully this helps with your swallowing.  Follow-up with GI in 2-3 months or sooner if needed.  I hope you have a great rest of your week!  Hennie Duos. Marletta Lor, D.O. Gastroenterology and Hepatology [facility]

## 2020-03-11 NOTE — Op Note (Signed)
[facility] Patient Name: Kiara [name] Procedure Date: 03/11/2020 1:22 PM MRN: [id number] Date of Birth: January 26, 1941 Attending MD: Elon Alas. Abbey Chatters DO CSN: [id number] Age: 79 Admit Type: Outpatient Procedure:                Upper GI endoscopy Indications:              Dysphagia Providers:                Elon Alas. Abbey Chatters, DO, Caprice Kluver, Charlsie Quest                            Joanne Gavel, RN, Rosina Lowenstein, RN, Randa Spike,                            Technician Referring MD:              Medicines:                See the Anesthesia note for documentation of the                            administered medications Complications:            No immediate complications. Estimated Blood Loss:     Estimated blood loss was minimal. Procedure:                Pre-Anesthesia Assessment:                           - The anesthesia plan was to use monitored                            anesthesia care (MAC).                           After obtaining informed consent, the endoscope was                            passed under direct vision. Throughout the                            procedure, the patient's blood pressure, pulse, and                            oxygen saturations were monitored continuously. The                            [id number]-H190 ([id number]) scope was introduced through the                            mouth, and advanced to the second part of duodenum.                            The upper GI endoscopy was accomplished without                            difficulty. The patient tolerated the procedure  well. Scope In: 1:34:58 PM Scope Out: 1:42:53 PM Total Procedure Duration: 0 hours 7 minutes 55 seconds  Findings:      Moderately severe esophagitis with no bleeding was found at the       gastroesophageal junction. Cells for cytology were obtained by brushing.      One benign-appearing, intrinsic mild stenosis was found in the lower       third of the  esophagus. The stenosis was traversed. A TTS dilator was       passed through the scope. Dilation with a 15-16.5-18 mm balloon dilator       was performed to 18 mm. The dilation site was examined and showed       moderate improvement in luminal narrowing.      Diffuse mild inflammation characterized by erythema was found in the       entire examined stomach. Biopsies were taken with a cold forceps for       Helicobacter pylori testing.      The duodenal bulb, first portion of the duodenum and second portion of       the duodenum were normal. Impression:               - Moderately severe reflux esophagitis with no                            bleeding. Cells for cytology obtained.                           - Benign-appearing esophageal stenosis. Dilated.                           - Gastritis. Biopsied.                           - Normal duodenal bulb, first portion of the                            duodenum and second portion of the duodenum. Moderate Sedation:      Per Anesthesia Care Recommendation:           - Patient has a contact number available for                            emergencies. The signs and symptoms of potential                            delayed complications were discussed with the                            patient. Return to normal activities tomorrow.                            Written discharge instructions were provided to the                            patient.                           - Resume previous  diet.                           - Continue present medications.                           - Await pathology results.                           - Repeat upper endoscopy PRN for retreatment.                           - Use Prilosec (omeprazole) 20 mg PO BID for 8                            weeks. Procedure Code(s):        --- Professional ---                           [phone], Esophagogastroduodenoscopy, flexible,                            transoral; with  transendoscopic balloon dilation of                            esophagus (less than 30 mm diameter)                           43239, 59, Esophagogastroduodenoscopy, flexible,                            transoral; with biopsy, single or multiple Diagnosis Code(s):        --- Professional ---                           K21.00, Gastro-esophageal reflux disease with                            esophagitis, without bleeding                           K22.2, Esophageal obstruction                           K29.70, Gastritis, unspecified, without bleeding                           R13.10, Dysphagia, unspecified CPT copyright 2019 American Medical Association. All rights reserved. The codes documented in this report are preliminary and upon coder review may  be revised to meet current compliance requirements. Elon Alas. Abbey Chatters, DO [place] Abbey Chatters, DO 03/11/2020 1:47:41 PM This report has been signed electronically. Number of Addenda: 0

## 2020-03-12 ENCOUNTER — Other Ambulatory Visit: Payer: Self-pay

## 2020-03-12 LAB — SURGICAL PATHOLOGY

## 2020-03-18 ENCOUNTER — Encounter (HOSPITAL_COMMUNITY): Payer: Self-pay | Admitting: Internal Medicine

## 2020-04-15 ENCOUNTER — Other Ambulatory Visit: Payer: Self-pay

## 2020-04-15 ENCOUNTER — Telehealth: Payer: Self-pay | Admitting: Gastroenterology

## 2020-04-15 ENCOUNTER — Telehealth: Payer: Medicare Other | Admitting: Gastroenterology

## 2020-04-15 NOTE — Telephone Encounter (Signed)
Kiara [name], I was unable to reach patient for virtual visit. Please reschedule. Thanks!

## 2020-04-16 ENCOUNTER — Encounter: Payer: Self-pay | Admitting: Internal Medicine

## 2020-04-16 NOTE — Telephone Encounter (Signed)
PATIENT SCHEDULED  °

## 2020-05-12 NOTE — Progress Notes (Deleted)
Cardiology Office Note   Date:  05/12/2020   ID:  Kiara [name], DOB [birth year], MRN [id number]  PCP:  Gareth Morgan, MD  Cardiologist:   Rollene Rotunda, MD Referring:  Gareth Morgan, MD  No chief complaint on file.     History of Present Illness: Kiara [name] is a 80 y.o. female who is referred by Gareth Morgan, MD for evaluation of palpitations and SOB.  She previously saw Dr. Dietrich Pates in 2011 for chest pain.  She had a negative stress echo.  When I saw her she had palpitations.  She had a monitor demonstrating runs of SVT with the longest being 17 beats.  ***   *** She denies any ongoing chest pain.  However, she has had palpitations.  This has been going on for some time.  She described to her primary provider as regular pulse sometimes in the morning.  She mentions feeling somewhat short of breath with this or lightheaded.  She said it would be slow.  It would last for a few minutes and then go away.  She could not bring it on.  She did not have any associated presyncope or syncope.  She feels fatigued and sleeps quite a bit but this has been ongoing.  She was tested for sleep apnea years ago and was told that she had this a mild delay.  She now lives alone and says nobody witnesses her sleeping.  She gets around slowly because of back problems.  She walks around with a cane.  He has had blood work to include normal thyroid and electrolytes.  However, she is not has any other heart testing other than described.    Past Medical History:  Diagnosis Date  . AC (acromioclavicular) joint bone spurs   . Arthritis   . Chronic fatigue   . Complication of anesthesia   . Fibromyalgia   . Osteoarthritis   . PONV (postoperative nausea and vomiting)   . Pre-diabetes   . Rheumatoid arthritis(714.0)   . Sciatica   . 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 ABDOMINAL HYSTERECTOMY    . ANUS SURGERY     fissures  . BACK SURGERY     3 back  surgeries  . BALLOON DILATION N/A 03/11/2020   Procedure: BALLOON DILATION;  Surgeon: Lanelle Bal, DO;  [facility];  Service: Endoscopy;  Laterality: N/A;  . BIOPSY  03/11/2020   Procedure: BIOPSY;  Surgeon: Lanelle Bal, DO;  [facility];  Service: Endoscopy;;  . CATARACT EXTRACTION W/PHACO Left 08/08/2017   Procedure: CATARACT EXTRACTION PHACO AND INTRAOCULAR LENS PLACEMENT LEFT EYE;  Surgeon: Gemma Payor, MD;  Location: [facility];  Service: Ophthalmology;  Laterality: Left;  CDE: 19.49  . CATARACT EXTRACTION W/PHACO Right 09/05/2017   Procedure: CATARACT EXTRACTION PHACO AND INTRAOCULAR LENS PLACEMENT RIGHT EYE;  Surgeon: Gemma Payor, MD;  Location: [facility];  Service: Ophthalmology;  Laterality: Right;  CDE: 11.76  . CHOLECYSTECTOMY    . ESOPHAGEAL BRUSHING  03/11/2020   Procedure: ESOPHAGEAL BRUSHING;  Surgeon: Lanelle Bal, DO;  [facility];  Service: Endoscopy;;  . ESOPHAGOGASTRODUODENOSCOPY (EGD) WITH PROPOFOL N/A 03/11/2020   Procedure: ESOPHAGOGASTRODUODENOSCOPY (EGD) WITH PROPOFOL;  Surgeon: Lanelle Bal, DO;  [facility];  Service: Endoscopy;  Laterality: N/A;  3:00pm  . left knee     X 2  . TONSILLECTOMY    . TUBAL LIGATION  Current Outpatient Medications  Medication Sig Dispense Refill  . ALPRAZolam (XANAX) 1 MG tablet Take 0.5-1 mg by mouth at bedtime as needed for anxiety.    Marland Kitchen amitriptyline (ELAVIL) 50 MG tablet Take 50 mg by mouth at bedtime.    Marland Kitchen b complex vitamins tablet Take 1 tablet by mouth daily.    . calcium carbonate (TUMS - DOSED IN MG ELEMENTAL CALCIUM) 500 MG chewable tablet Chew 1 tablet by mouth daily as needed for indigestion or heartburn.    Marland Kitchen HYDROcodone-acetaminophen (NORCO) 7.5-325 MG tablet Take 1 tablet by mouth 3 (three) times daily as needed for pain.    Marland Kitchen ibuprofen (ADVIL,MOTRIN) 200 MG tablet Take 200 mg by mouth daily as needed for moderate pain.    . metoprolol tartrate  (LOPRESSOR) 50 MG tablet Take 50 mg by mouth 2 (two) times daily.    . Multiple Vitamins-Minerals (MULTIVITAMIN ADULTS PO) Take 1 tablet by mouth daily.    Marland Kitchen omeprazole (PRILOSEC) 20 MG capsule Take 1 capsule (20 mg total) by mouth 2 (two) times daily before a meal. 60 capsule 5  . sodium chloride (OCEAN) 0.65 % SOLN nasal spray Place 1 spray into both nostrils as needed for congestion.     Marland Kitchen venlafaxine XR (EFFEXOR-XR) 37.5 MG 24 hr capsule Take 37.5 mg by mouth daily with breakfast.      No current facility-administered medications for this visit.    Allergies:   Nsaids, Celebrex [celecoxib], Gabapentin, and Sterapred [prednisone]    ROS:  Please see the history of present illness.   Otherwise, review of systems are positive for ***.   All other systems are reviewed and negative.    PHYSICAL EXAM: VS:  There were no vitals taken for this visit. , BMI There is no height or weight on file to calculate BMI. GENERAL:  Well appearing NECK:  No jugular venous distention, waveform within normal limits, carotid upstroke brisk and symmetric, no bruits, no thyromegaly LUNGS:  Clear to auscultation bilaterally CHEST:  Unremarkable HEART:  PMI not displaced or sustained,S1 and S2 within normal limits, no S3, no S4, no clicks, no rubs, *** murmurs ABD:  Flat, positive bowel sounds normal in frequency in pitch, no bruits, no rebound, no guarding, no midline pulsatile mass, no hepatomegaly, no splenomegaly EXT:  2 plus pulses throughout, no edema, no cyanosis no clubbing    ***GENERAL:  Well appearing HEENT:  Pupils equal round and reactive, fundi not visualized, oral mucosa unremarkable NECK:  No jugular venous distention, waveform within normal limits, carotid upstroke brisk and symmetric, no bruits, no thyromegaly LYMPHATICS:  No cervical, inguinal adenopathy LUNGS:  Clear to auscultation bilaterally BACK:  No CVA tenderness CHEST:  Unremarkable HEART:  PMI not displaced or sustained,S1 and  S2 within normal limits, no S3, no S4, no clicks, no rubs, no murmurs ABD:  Flat, positive bowel sounds normal in frequency in pitch, no bruits, no rebound, no guarding, no midline pulsatile mass, no hepatomegaly, no splenomegaly EXT:  2 plus pulses throughout, no edema, no cyanosis no clubbing SKIN:  No rashes no nodules NEURO:  Cranial nerves II through XII grossly intact, motor grossly intact throughout PSYCH:  Cognitively intact, oriented to person place and time    EKG:  EKG is *** ordered today. The ekg ordered today demonstrates sinus rhythm, rate ***, axis within normal limits, intervals within normal limits, no acute ST-T wave changes.   Recent Labs: 01/07/2020: Platelets 270 02/15/2020: ALT 10; BUN 10; Creatinine, Ser 0.74;  Hemoglobin 10.4; Potassium 4.4; Sodium 139    Lipid Panel    Component Value Date/Time   CHOL 205 03/31/2009 0000   TRIG 138 03/31/2009 0000   HDL 48 03/31/2009 0000   LDLCALC 129 03/31/2009 0000      Wt Readings from Last 3 Encounters:  03/11/20 135 lb (61.2 kg)  02/12/20 146 lb (66.2 kg)  02/01/20 144 lb 3.2 oz (65.4 kg)      Other studies Reviewed: Additional studies/ records that were reviewed today include: *** Review of the above records demonstrates:  Please see elsewhere in the note.     ASSESSMENT AND PLAN:  Palpitations:  She was to be treated with PRN Cardizem.  *** I am going to apply a 2-week monitor.  Further management will be pending these results.  SOB: ***  This has been chronic and probably related to her chronic back pain and some deconditioning related to this.  Dyslipidemia: She has an LDL of  ***145.  However, she has tried statins and has not tolerated them.  At this point I am not inclined to suggest PCSK9 in the absence of any known vascular disease.    Current medicines are reviewed at length with the patient today.  The patient does not have concerns regarding medicines.  The following changes have been  made:  ***  Labs/ tests ordered today include: ***  No orders of the defined types were placed in this encounter.    Disposition:   FU with ***    Signed, Rollene Rotunda, MD  05/12/2020 7:16 PM    [facility]  '

## 2020-05-13 ENCOUNTER — Ambulatory Visit: Payer: Medicare Other | Admitting: Cardiology

## 2020-05-29 ENCOUNTER — Ambulatory Visit: Payer: Medicare Other | Admitting: Gastroenterology

## 2020-11-19 ENCOUNTER — Telehealth: Payer: Self-pay | Admitting: Physical Medicine & Rehabilitation

## 2020-11-19 NOTE — Telephone Encounter (Signed)
3 identifiers used to confirm pt.   Per internal inBasket msgs pt was originally offered appt Thurs 8/25 1pm but son declined and scheduled for 12/12/20.      Son/Jason Harrell canceled 12/12/20 appt stating unable to make appt that day. Patient r/s to 01/09/21 . Son mentioned he is  going to contact [facility] to see if possible closer provider available.      Dorthula Nettles  [facility]  Physical Medicine & Rehabilitation  Clinic # [phone]  Fax# [phone]  Direct # [phone]

## 2020-11-24 ENCOUNTER — Telehealth: Payer: Self-pay | Admitting: Physical Medicine & Rehabilitation

## 2020-11-24 NOTE — Telephone Encounter (Signed)
Called and spoke with son/caregiver of pt. 3 identifiers used.     Offered this Friday at 9am or 1130am ([facility] due to cancellations), and 1230pm on 9/16 [facility] per Dr Alona Bene). Per son unable to come in until 10/14 appointment due to coordination of schedules and pt having appointments with GP and seeing if pt "able to make the trip."     Son appreciated phone calls and clinic attempt to see patient sooner but refused earlier offered appointment times/dates.     This RN let son know to call clinic if changed mind. Carnella Guadalajara, RN

## 2020-12-12 ENCOUNTER — Ambulatory Visit: Payer: Medicare Other | Admitting: Physical Medicine & Rehabilitation

## 2021-01-09 ENCOUNTER — Ambulatory Visit: Payer: Medicare Other | Admitting: Physical Medicine & Rehabilitation

## 2021-02-26 DEATH — deceased

## 2021-05-03 IMAGING — DX DG LUMBAR SPINE 2-3V
3 series · 3 of 3 positions shown · non-contrast
Comparison: 09/28/2019

CLINICAL DATA: Back and right leg pain.

EXAM:
LUMBAR SPINE - 2-3 VIEW

[l-spine ap]
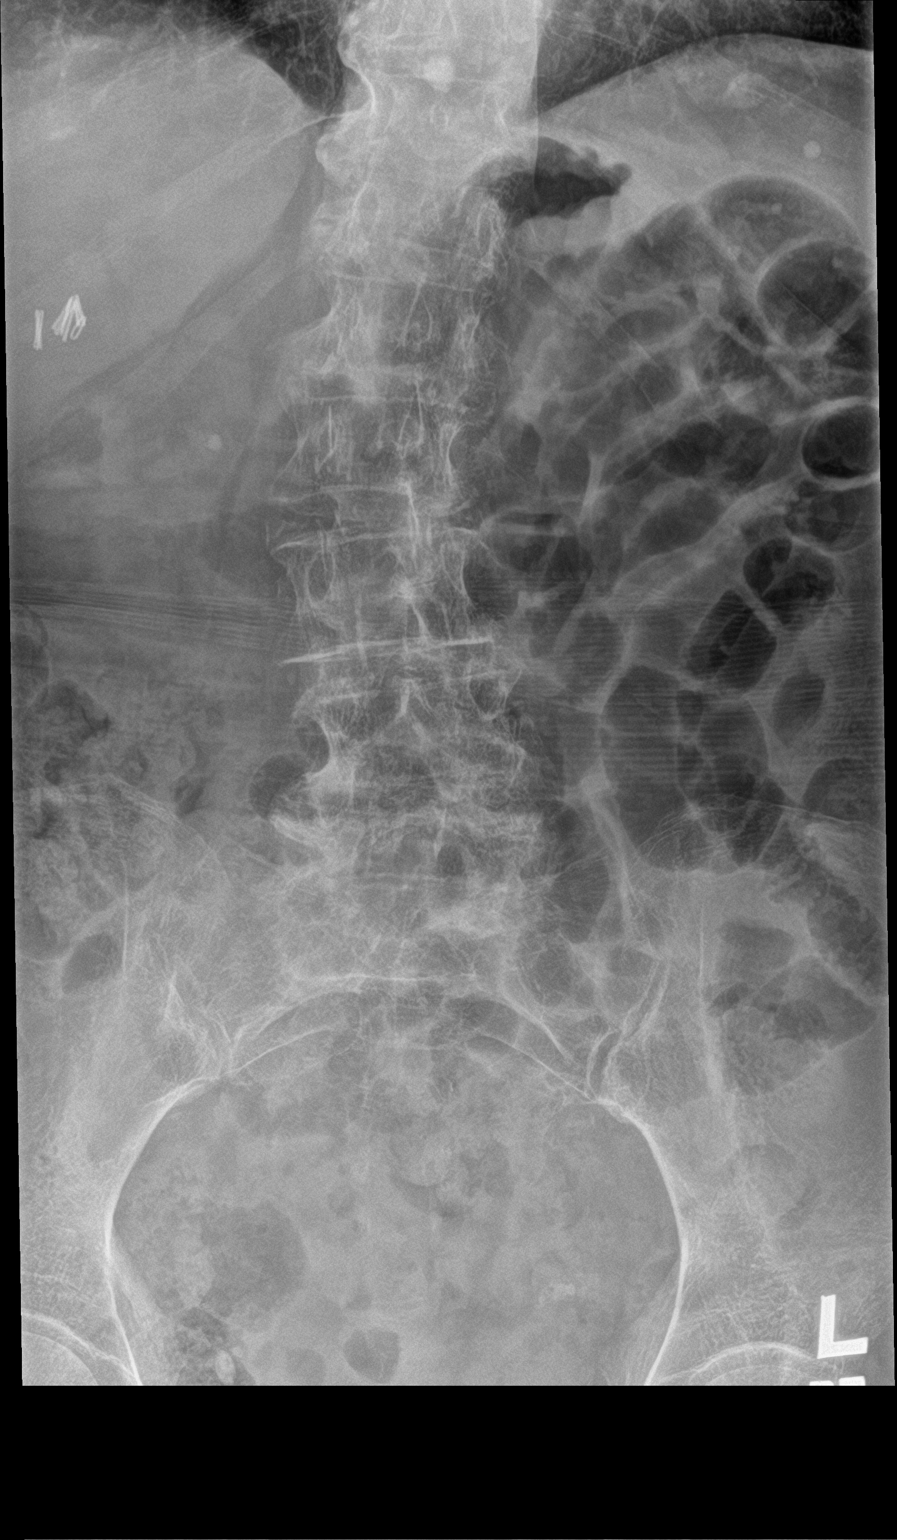

[l-spine lat]
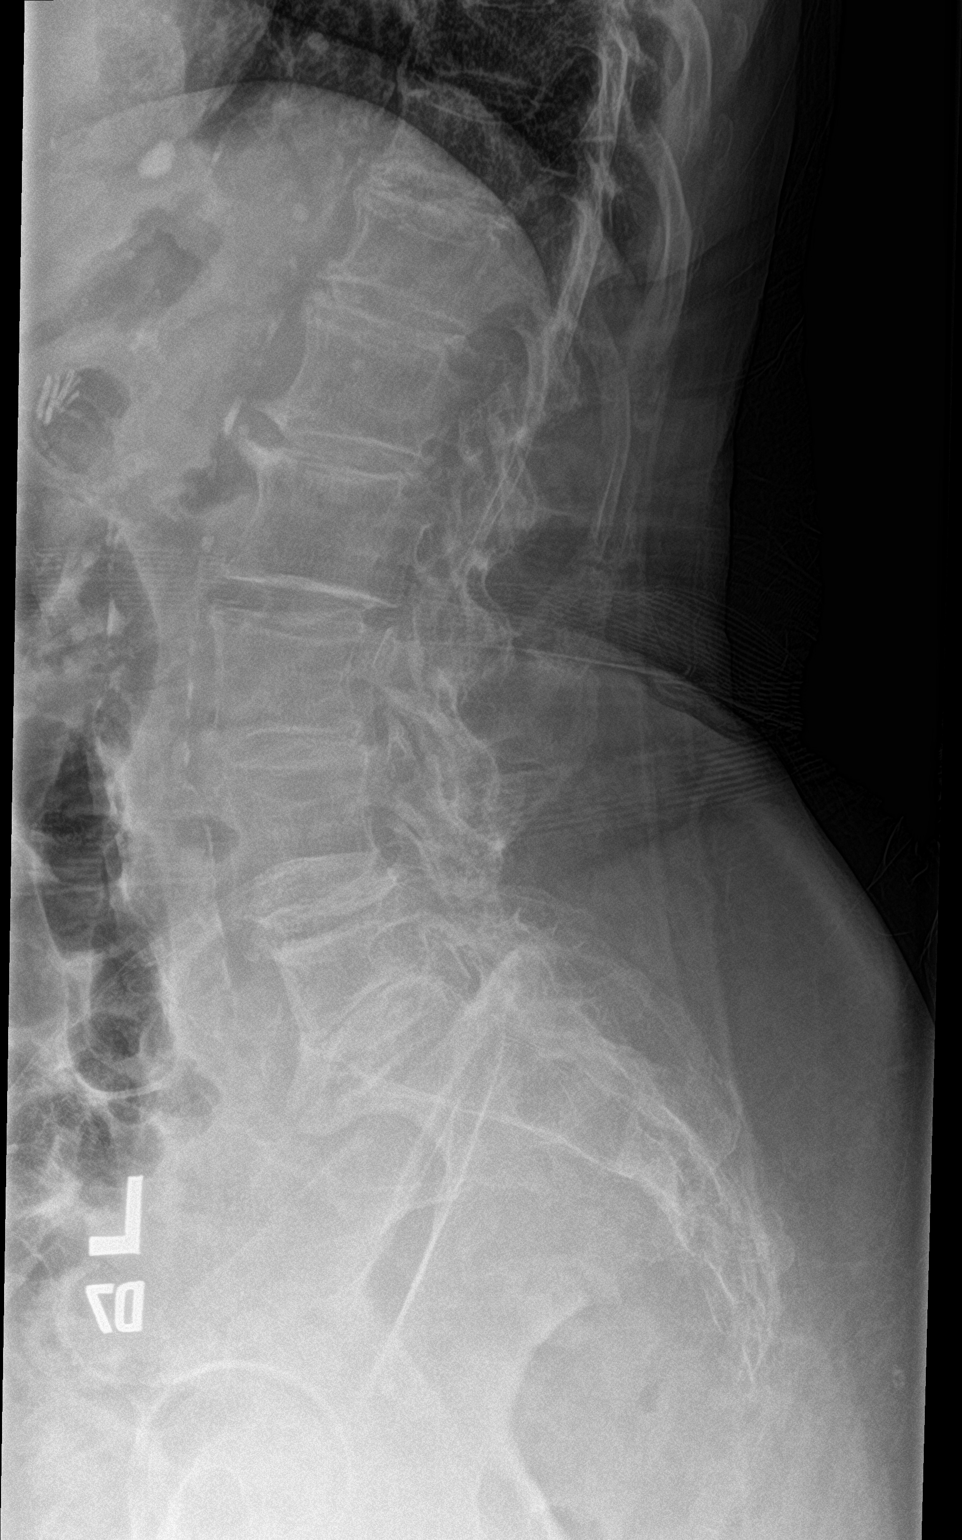

[l-spine spot]
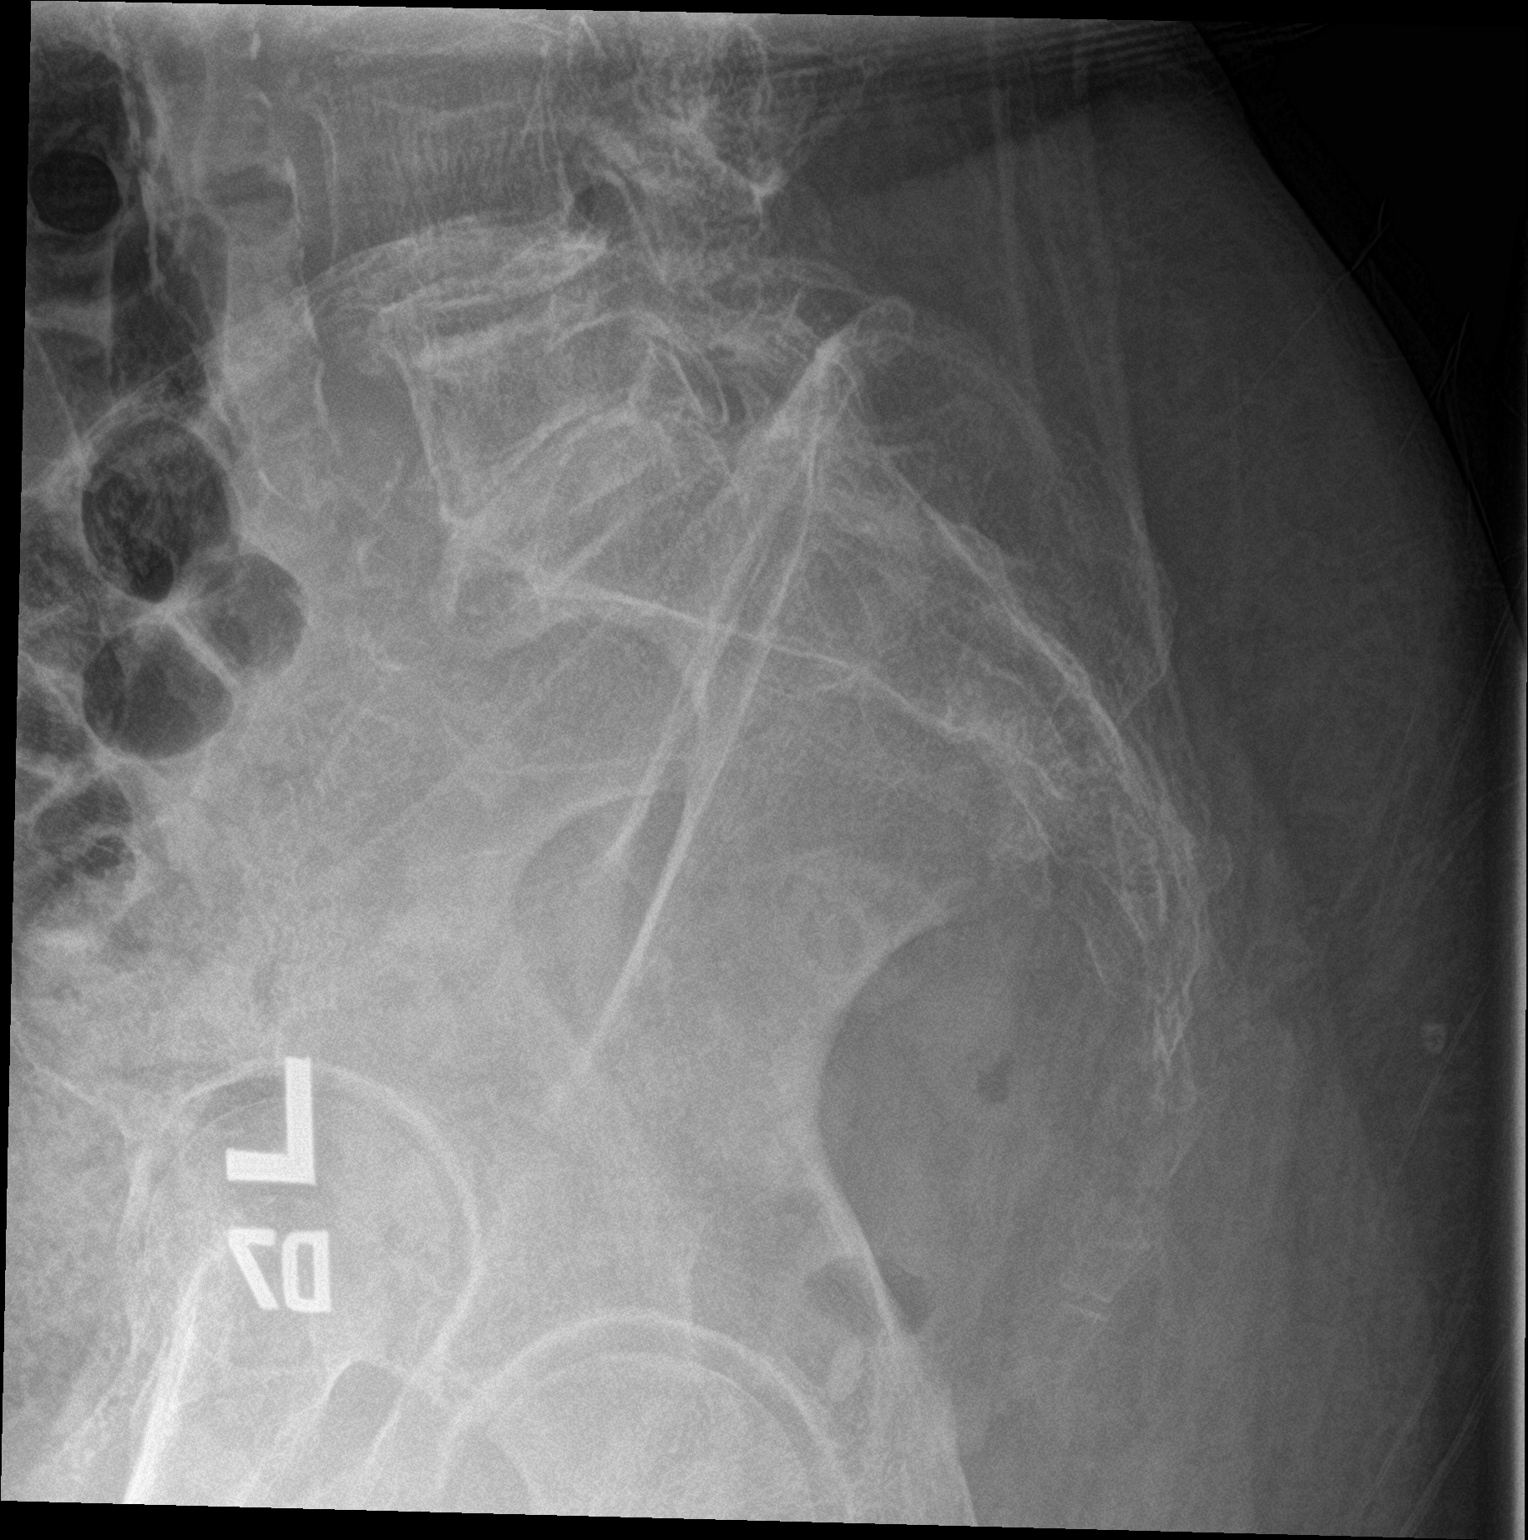

[3 of 3 positions shown; findings below may reference images not displayed]

FINDINGS: Stable scoliosis and degenerative lumbar spondylosis with multilevel
disc disease and facet disease. Mild chronic compression deformity
of L5. No acute bony findings or destructive bony changes. The
visualized bony pelvis is grossly intact. The SI joints appear
normal. Stable moderate vascular calcifications.
IMPRESSION: 1. Scoliosis and degenerative lumbar spondylosis with multilevel
disc disease and facet disease.
2. No acute bony findings.

## 2021-07-13 IMAGING — US US ABDOMEN COMPLETE
1 series · 13 of 25 positions shown · non-contrast
Comparison: CT abdomen dated 11/14/2019

CLINICAL DATA: Abdominal pain

EXAM:
ABDOMEN ULTRASOUND COMPLETE

[Series 1: us abdomen complete · 0.19mm/px · 13 of 149 slices shown]
[im 1/149]
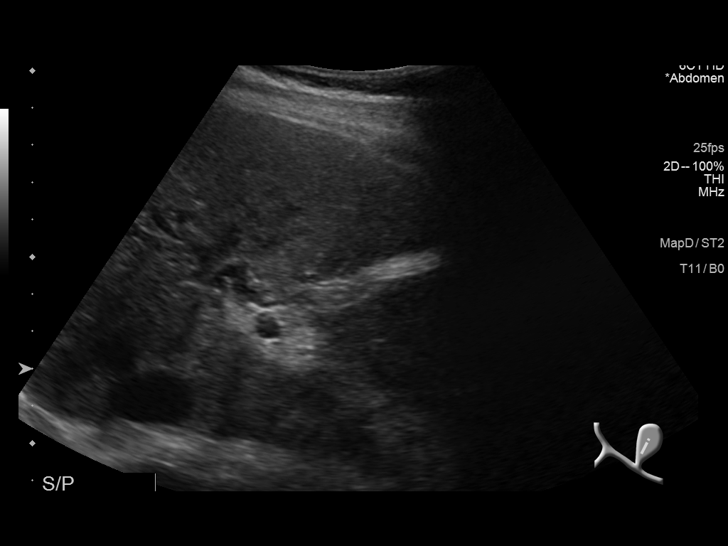
[im 13/149]
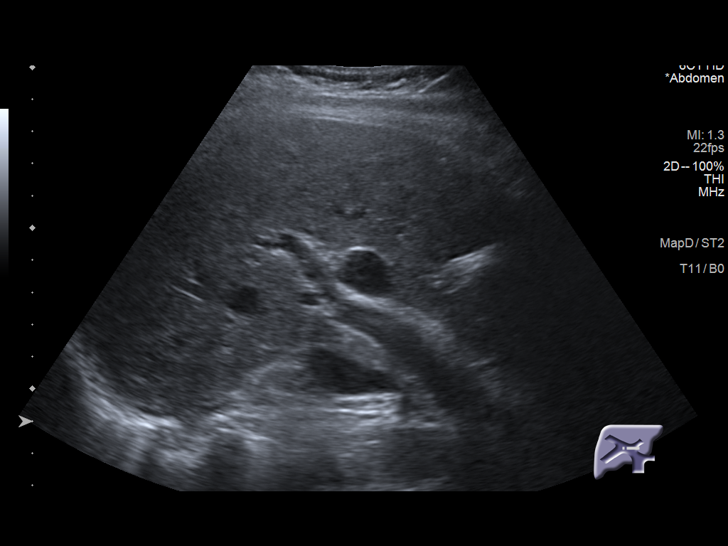
[im 25/149]
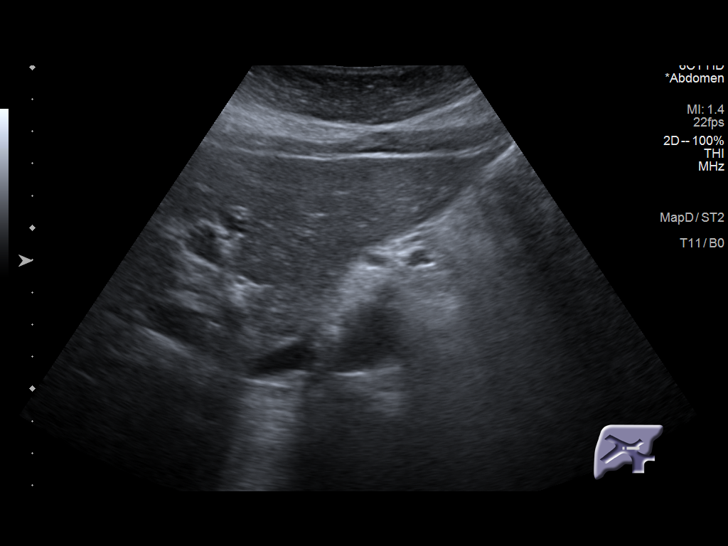
[im 38/149]
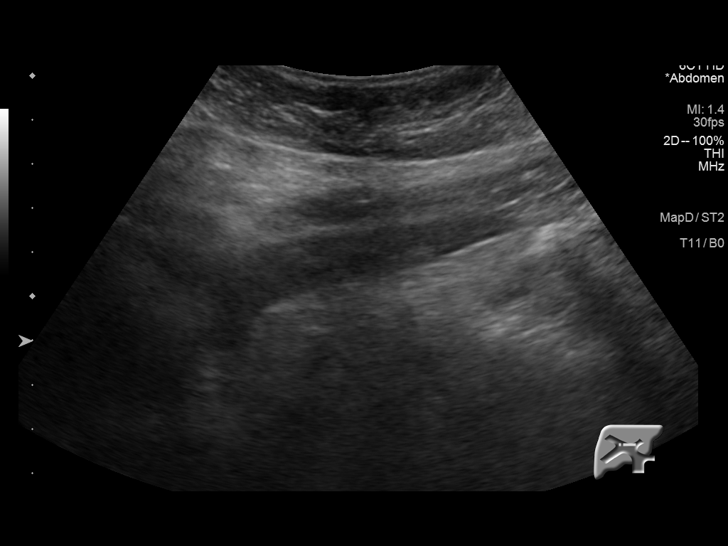
[im 50/149]
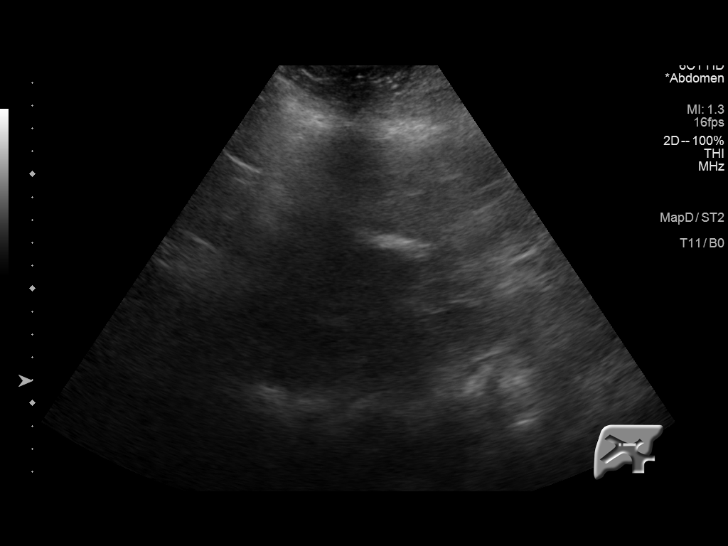
[im 62/149]
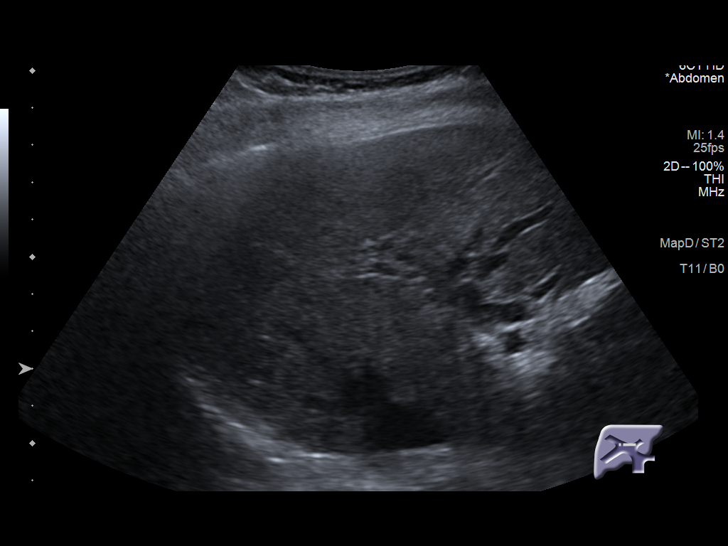
[im 75/149]
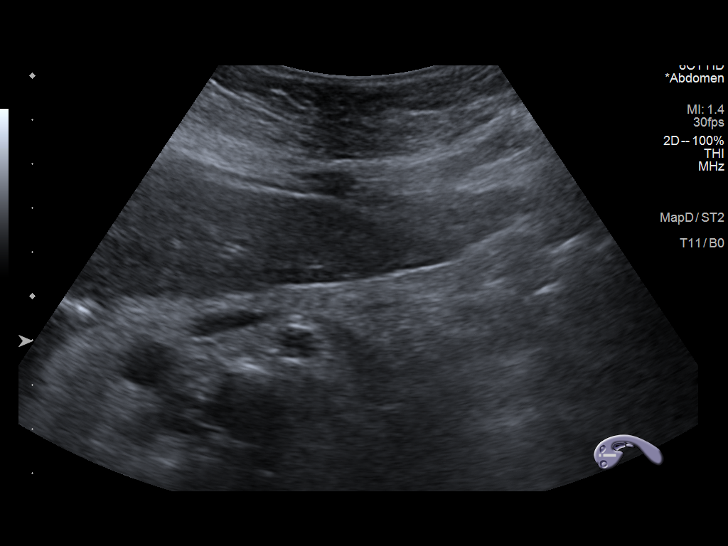
[im 87/149]
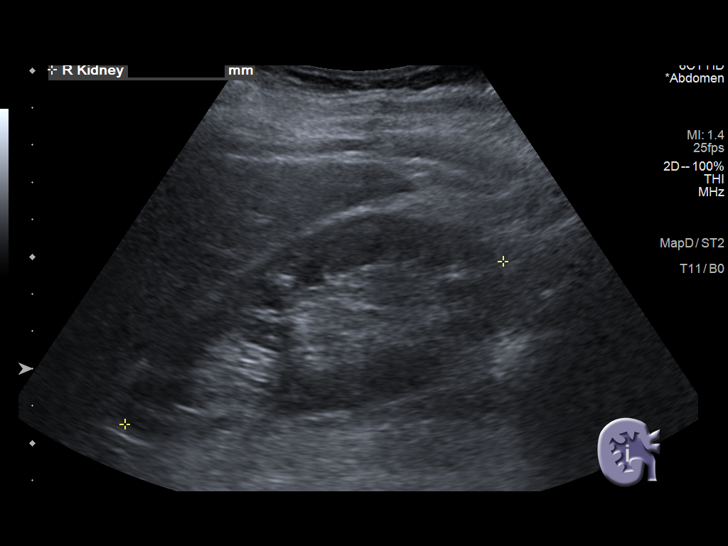
[im 99/149]
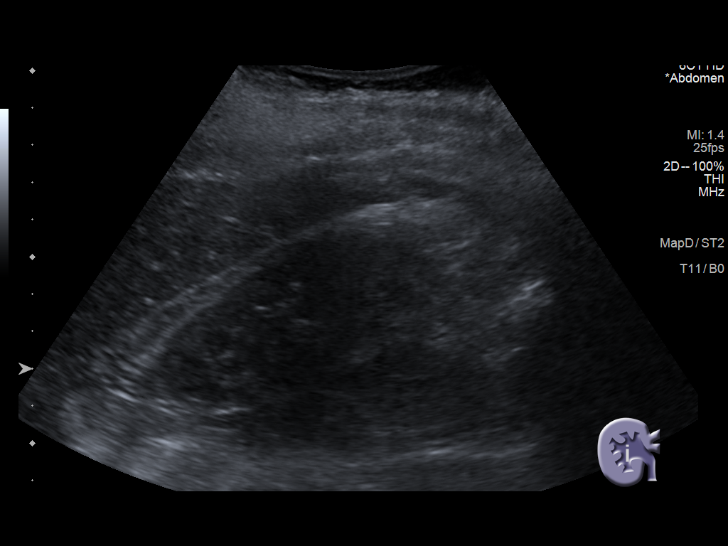
[im 112/149]
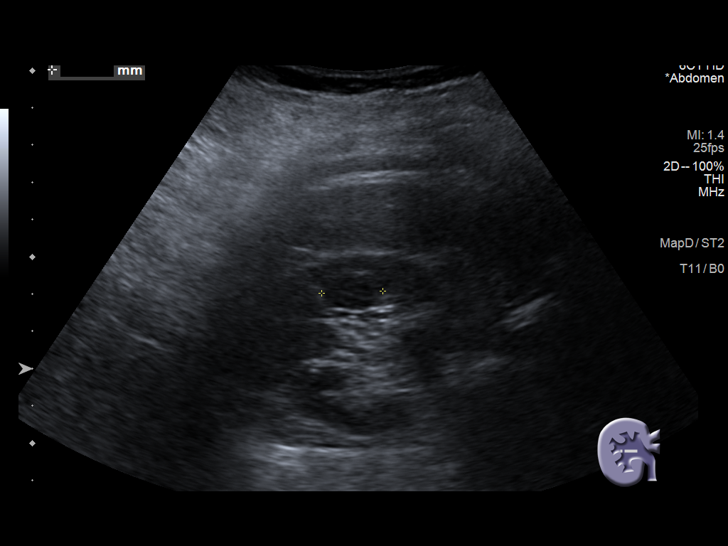
[im 124/149]
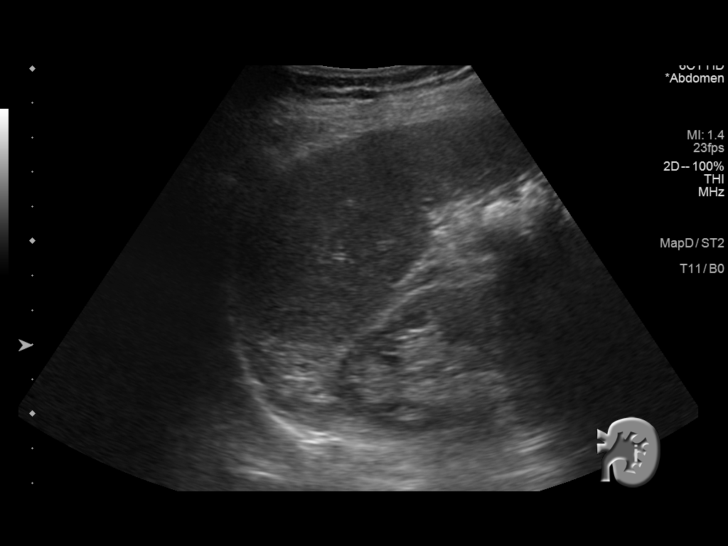
[im 136/149]
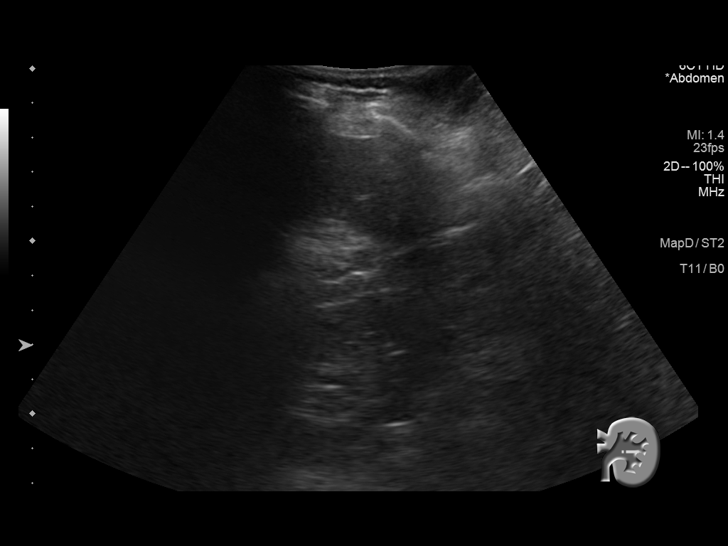
[im 149/149]
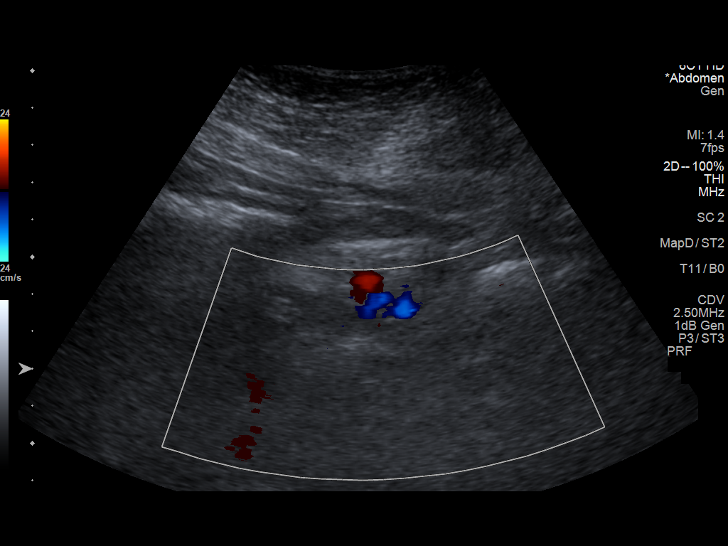

[13 of 25 positions shown; findings below may reference images not displayed]

FINDINGS: Gallbladder: Surgically absent.

Common bile duct: Diameter: 13 mm, previously 15 mm on CT. Mild
intrahepatic ductal dilatation is suspected, also present on CT.

Liver: Hyperechoic hepatic parenchyma. No focal hepatic lesion is
seen. Portal vein is patent on color Doppler imaging with normal
direction of blood flow towards the liver.

IVC: No abnormality visualized.

Pancreas: Visualized portion unremarkable.

Spleen: Size and appearance within normal limits.

Right Kidney: Length: 11.1 cm. Echogenicity within normal limits.
Central right renal lesion corresponds to a bifid collecting system
on CT. No mass or hydronephrosis.

Left Kidney: Length: 7.9 cm, although this is likely poorly
visualized and undermeasured correlating with CT (10.4 cm on CT).
Echogenicity within normal limits. No mass or hydronephrosis.

Abdominal aorta: No aneurysm visualized.

Other findings: None.
IMPRESSION: Status post cholecystectomy. Mild intrahepatic and extrahepatic
ductal dilatation, likely postsurgical.

Hyperechoic hepatic parenchyma, raising the possibility of mild
hepatic steatosis, although this is equivocal.

Left kidney is poorly visualized on ultrasound but normal when
correlating with recent CT. No hydronephrosis.
